# Patient Record
Sex: Female | Born: 1935 | Race: White | Hispanic: No
Health system: Southern US, Community
[De-identification: ages and names within clinical notes are randomized; demographics above are authoritative.]

## PROBLEM LIST (undated history)

## (undated) DIAGNOSIS — M810 Age-related osteoporosis without current pathological fracture: Secondary | ICD-10-CM

## (undated) DIAGNOSIS — E559 Vitamin D deficiency, unspecified: Secondary | ICD-10-CM

## (undated) DIAGNOSIS — H3530 Unspecified macular degeneration: Secondary | ICD-10-CM

## (undated) DIAGNOSIS — E049 Nontoxic goiter, unspecified: Secondary | ICD-10-CM

## (undated) DIAGNOSIS — E039 Hypothyroidism, unspecified: Secondary | ICD-10-CM

## (undated) HISTORY — PX: APPENDECTOMY: SHX54

## (undated) HISTORY — PX: BUNIONECTOMY: SHX129

## (undated) HISTORY — PX: TONSILLECTOMY: SUR1361

## (undated) HISTORY — PX: COLONOSCOPY: SHX174

## (undated) HISTORY — PX: OTHER SURGICAL HISTORY: SHX169

---

## 2001-07-18 ENCOUNTER — Encounter: Admission: RE | Admit: 2001-07-18 | Discharge: 2001-07-18 | Payer: Self-pay | Admitting: Internal Medicine

## 2001-07-18 ENCOUNTER — Encounter: Payer: Self-pay | Admitting: Internal Medicine

## 2004-07-29 ENCOUNTER — Ambulatory Visit: Payer: Self-pay | Admitting: Internal Medicine

## 2004-08-05 ENCOUNTER — Encounter: Admission: RE | Admit: 2004-08-05 | Discharge: 2004-08-05 | Payer: Self-pay | Admitting: Internal Medicine

## 2004-08-20 ENCOUNTER — Ambulatory Visit: Payer: Self-pay | Admitting: Internal Medicine

## 2004-09-18 ENCOUNTER — Ambulatory Visit (HOSPITAL_COMMUNITY): Admission: AD | Admit: 2004-09-18 | Discharge: 2004-09-20 | Payer: Self-pay | Admitting: General Surgery

## 2004-09-18 ENCOUNTER — Encounter (INDEPENDENT_AMBULATORY_CARE_PROVIDER_SITE_OTHER): Payer: Self-pay | Admitting: Specialist

## 2005-02-25 ENCOUNTER — Observation Stay (HOSPITAL_COMMUNITY): Admission: EM | Admit: 2005-02-25 | Discharge: 2005-02-25 | Payer: Self-pay | Admitting: Emergency Medicine

## 2005-03-01 ENCOUNTER — Ambulatory Visit (HOSPITAL_COMMUNITY): Admission: RE | Admit: 2005-03-01 | Discharge: 2005-03-01 | Payer: Self-pay | Admitting: Urology

## 2005-03-01 ENCOUNTER — Ambulatory Visit (HOSPITAL_BASED_OUTPATIENT_CLINIC_OR_DEPARTMENT_OTHER): Admission: RE | Admit: 2005-03-01 | Discharge: 2005-03-01 | Payer: Self-pay | Admitting: Urology

## 2005-08-31 ENCOUNTER — Ambulatory Visit: Payer: Self-pay | Admitting: Internal Medicine

## 2005-09-09 ENCOUNTER — Encounter (INDEPENDENT_AMBULATORY_CARE_PROVIDER_SITE_OTHER): Payer: Self-pay | Admitting: *Deleted

## 2005-09-09 ENCOUNTER — Ambulatory Visit: Payer: Self-pay | Admitting: Internal Medicine

## 2006-01-28 ENCOUNTER — Encounter: Admission: RE | Admit: 2006-01-28 | Discharge: 2006-01-28 | Payer: Self-pay | Admitting: Internal Medicine

## 2006-02-23 IMAGING — CT CT ABDOMEN W/O CM
1 of 2 series · 15 of 32 positions shown, 19 images · IV contrast (agent unspecified)
Comparison: none

CLINICAL DATA: Right flank pain.
 ABDOMEN CT WITHOUT CONTRAST:
TECHNIQUE: Multidetector CT imaging of the abdomen was performed following the standard protocol without IV contrast.
TECHNIQUE: Multidetector CT imaging of the pelvis was performed following the standard protocol without IV contrast.

[Series 2: abd/pelv w/o 5.0 b30f st · axial · non-contrast · 0.67mm/px · z∈[-373,-28]mm · 15 of 76 slices shown, 19 images]
[im 4/76  soft-tissue]
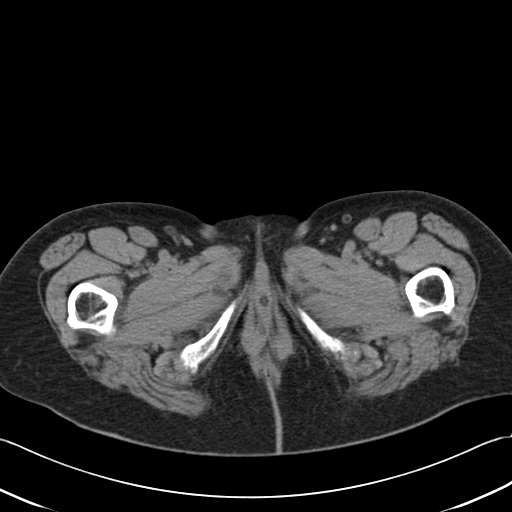
[im 4/76  bone]
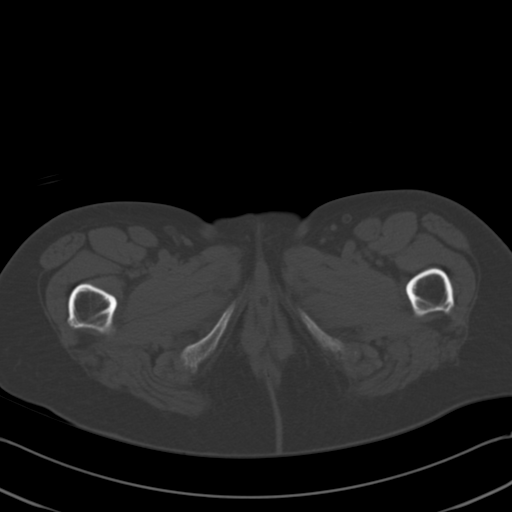
[im 10/76  soft-tissue]
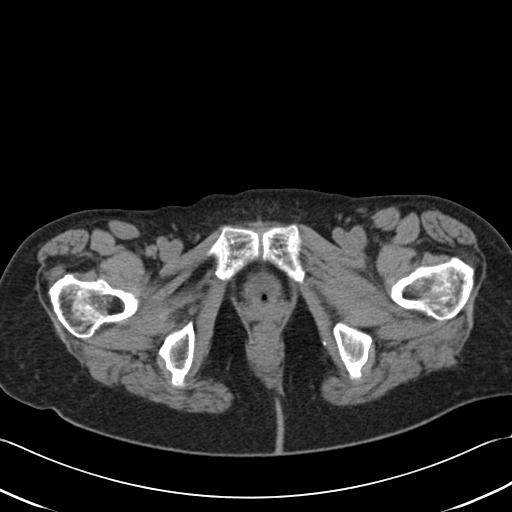
[im 16/76  soft-tissue]
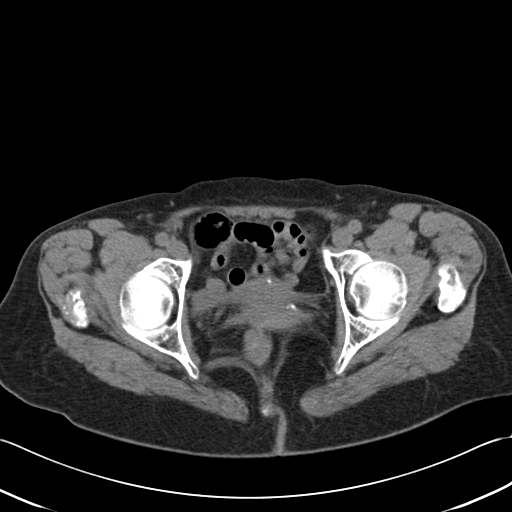
[im 22/76  soft-tissue]
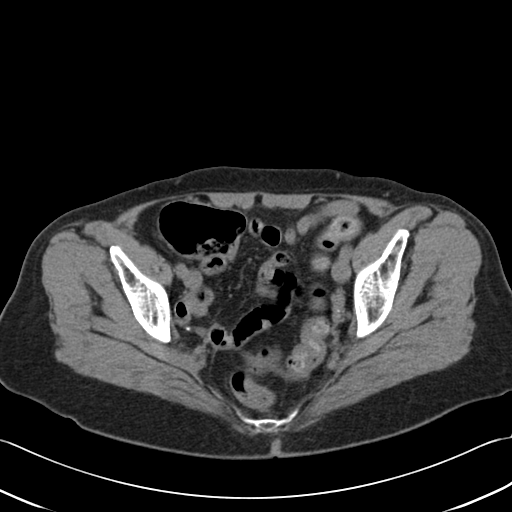
[im 28/76  soft-tissue]
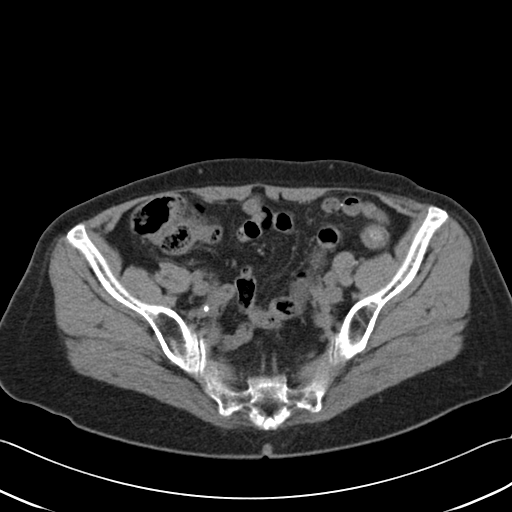
[im 34/76  soft-tissue]
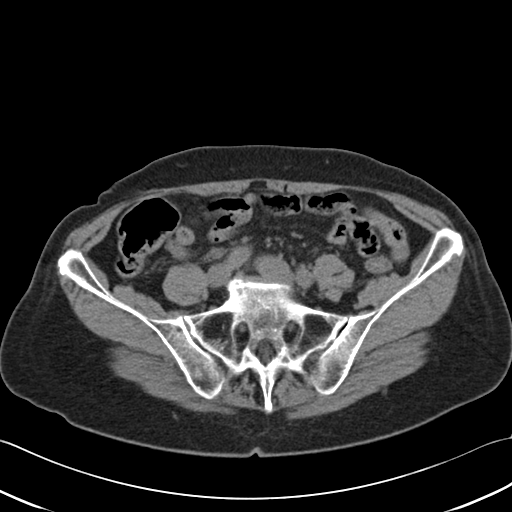
[im 40/76  soft-tissue]
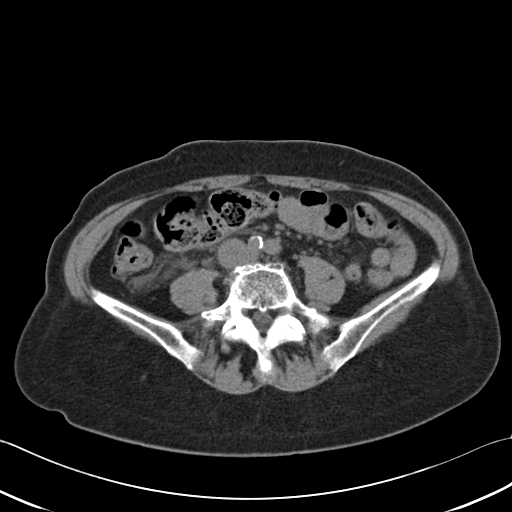
[im 43/76  soft-tissue]
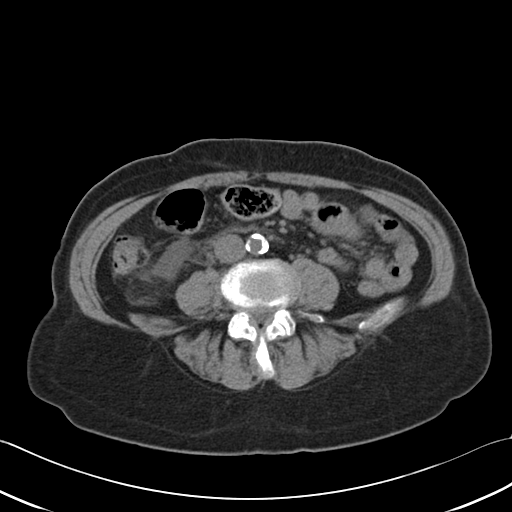
[im 49/76  soft-tissue]
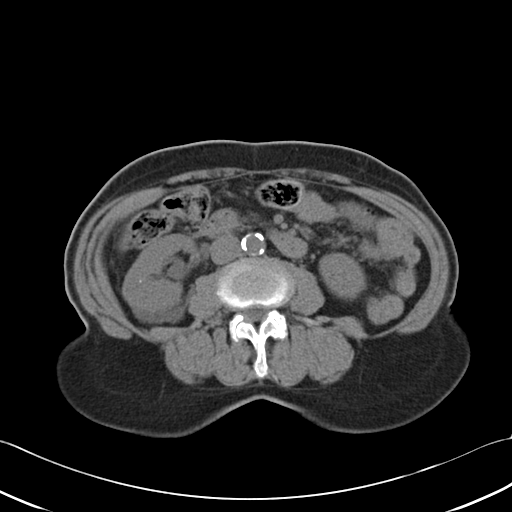
[im 49/76  bone]
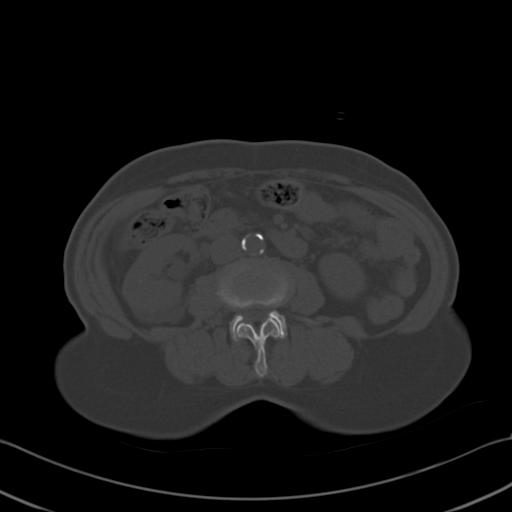
[im 55/76  soft-tissue]
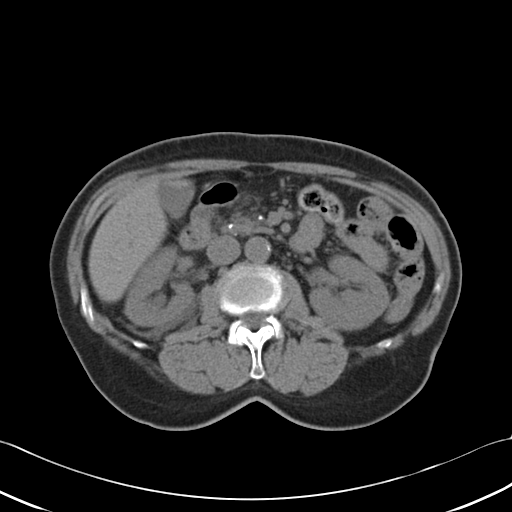
[im 61/76  soft-tissue]
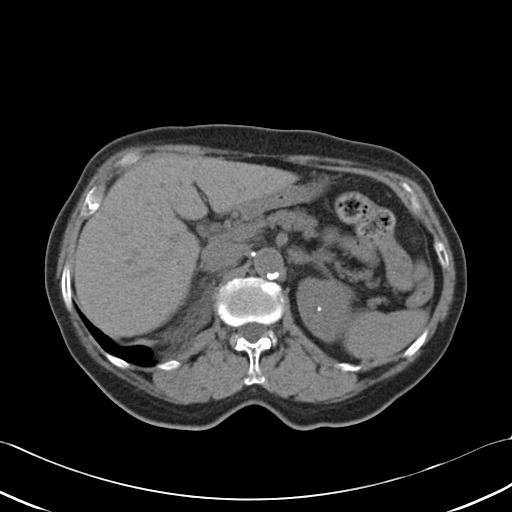
[im 64/76  lung]
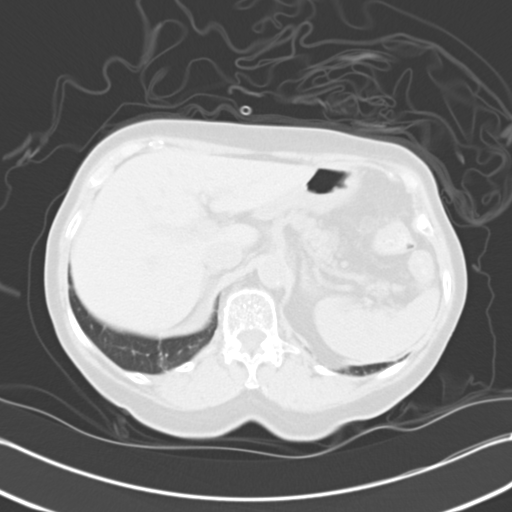
[im 67/76  soft-tissue]
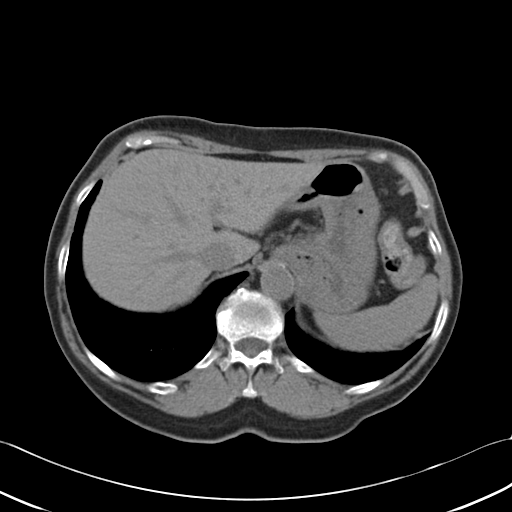
[im 67/76  lung]
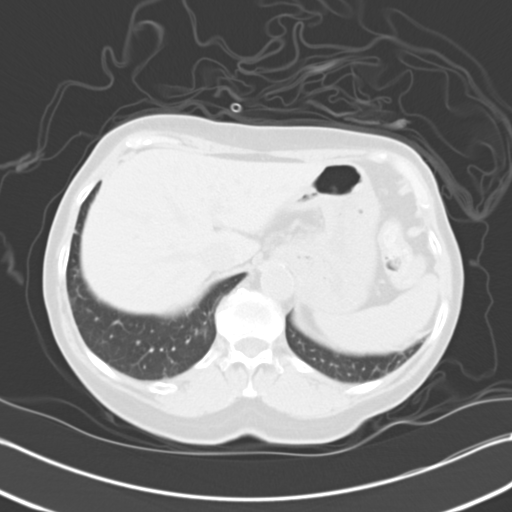
[im 70/76  lung]
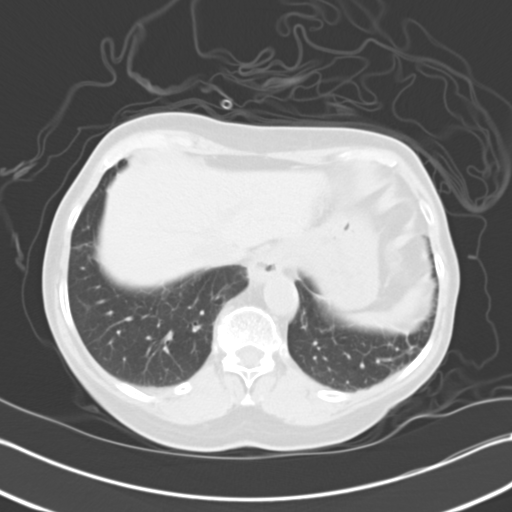
[im 73/76  soft-tissue]
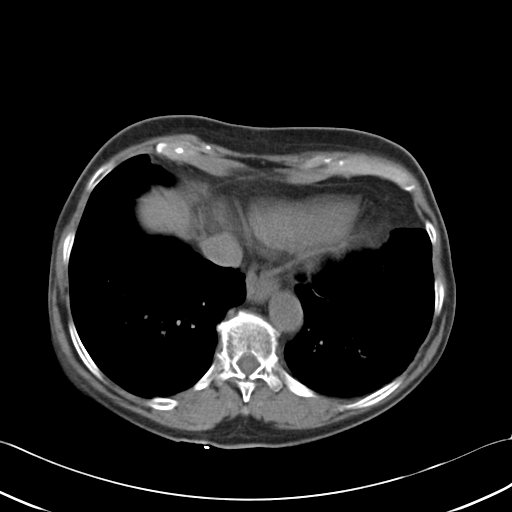
[im 73/76  lung]
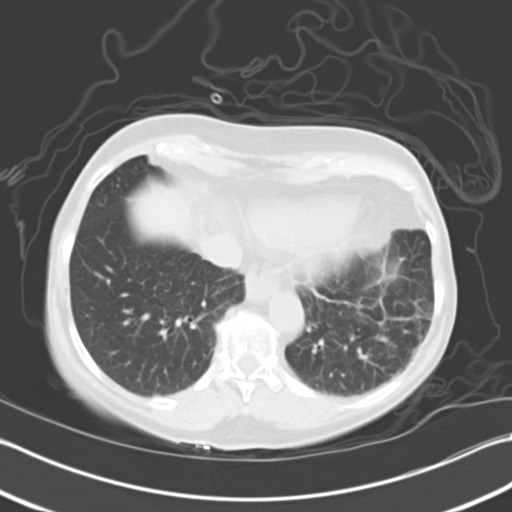

[15 of 32 positions shown; findings below may reference images not displayed]

FINDINGS: Linear scarring is noted particularly in the left lower lobe and lingula.  A small hiatal hernia present.  There is moderate right hydronephrosis and some perinephric fluid is noted along the posterior pararenal space.  This probably indicated forniceal rupture.  Bilateral renal calculi are present with four calculi on the right and one on the left.  The right ureter is dilated into the pelvis and CT of the pelvis will be performed.  The remainder of the study shows the liver to appear normal in the unenhanced state.  The pancreas is normal in size with normal peripancreatic fat planes.  The adrenal glands also appear normal and the spleen is normal in size.
IMPRESSION: 1.  Moderate right hydronephrosis and hydroureter into the pelvis.  CT of the pelvis to be performed.  Some perinephric fluid is noted on the right probably due to forniceal rupture.
 2.  Renal calculi, right more numerous than left.
 3.  Small hiatal hernia.
 PELVIS CT WITHOUT CONTRAST:
FINDINGS: Scans were continued through the pelvis in the unenhanced state.  The right ureter remains dilated to a point of obstruction by a distal right ureteral calculus of approximately 5 mm.  The ureter is difficult to follow in the pelvis, but I believe the obstructing calculus is 5 mm as seen on image #49.  The urinary bladder is decompressed with a Foley catheter present.  The left ureter is normal in caliber.
IMPRESSION: Moderate right hydronephrosis and hydroureter caused by a 5 mm distal right ureteral calculus at the level of the inferior aspect of the right sacroiliac joint.

## 2006-02-27 IMAGING — CR DG ABDOMEN 1V
1 series · 1 of 1 positions shown · non-contrast
Comparison: CT dated 02/25/05.

CLINICAL DATA: Ureteral calculus on the right.  
 SINGLE FRONTAL ABDOMINAL RADIOGRAPH ? 03/01/05:

[t abdomen supine]
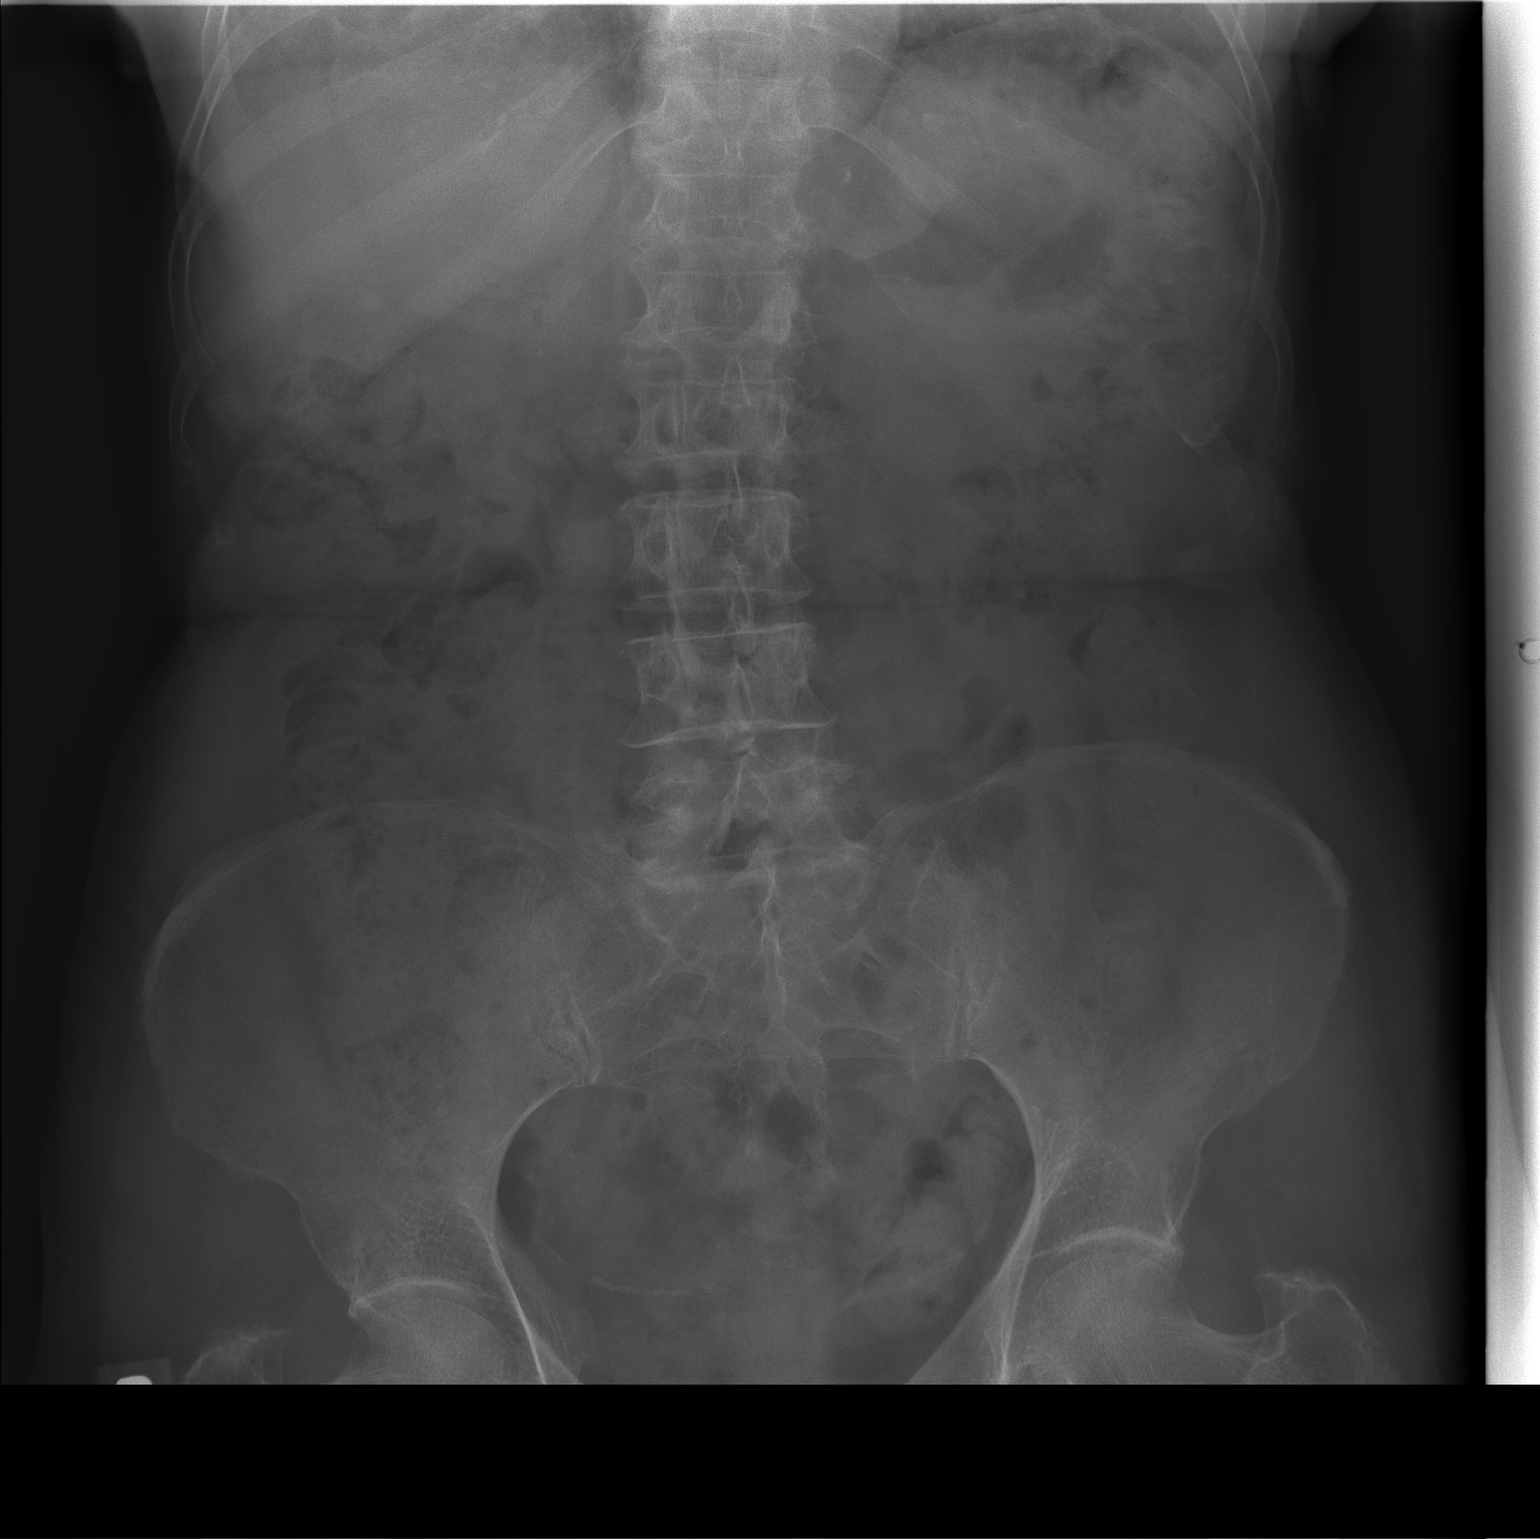

[1 of 1 positions shown; findings below may reference images not displayed]

FINDINGS: The bowel gas pattern is normal.  There is an 8 mm calcific opacity overlying the right upper quadrant as well as two vague oval densities overlying the sacrum.  No prior study is available prior to the prior CT.  One of the two densities overlying the sacrum may represent the previously seen calculus at that level on the prior CT.  Osseous structures are intact.
IMPRESSION: Three nodular opacities overlying the right upper quadrant and sacrum respectively; one of the two lower densities may represent the previously seen calculus on the prior CT, although it is difficult to correlate the exact location with the prior study due to differences in technique.

## 2006-09-12 ENCOUNTER — Ambulatory Visit: Payer: Self-pay | Admitting: Internal Medicine

## 2006-09-26 ENCOUNTER — Encounter (INDEPENDENT_AMBULATORY_CARE_PROVIDER_SITE_OTHER): Payer: Self-pay | Admitting: Specialist

## 2006-09-26 ENCOUNTER — Ambulatory Visit: Payer: Self-pay | Admitting: Internal Medicine

## 2007-10-30 ENCOUNTER — Ambulatory Visit: Payer: Self-pay | Admitting: Internal Medicine

## 2007-11-09 ENCOUNTER — Ambulatory Visit: Payer: Self-pay | Admitting: Internal Medicine

## 2007-11-09 ENCOUNTER — Encounter: Payer: Self-pay | Admitting: Internal Medicine

## 2008-11-05 ENCOUNTER — Encounter: Admission: RE | Admit: 2008-11-05 | Discharge: 2008-11-05 | Payer: Self-pay | Admitting: Internal Medicine

## 2008-11-07 ENCOUNTER — Encounter (INDEPENDENT_AMBULATORY_CARE_PROVIDER_SITE_OTHER): Payer: Self-pay | Admitting: *Deleted

## 2008-11-19 ENCOUNTER — Ambulatory Visit: Payer: Self-pay | Admitting: Internal Medicine

## 2008-12-03 ENCOUNTER — Encounter: Payer: Self-pay | Admitting: Internal Medicine

## 2008-12-03 ENCOUNTER — Ambulatory Visit: Payer: Self-pay | Admitting: Internal Medicine

## 2008-12-05 ENCOUNTER — Encounter: Payer: Self-pay | Admitting: Internal Medicine

## 2009-12-09 ENCOUNTER — Encounter (INDEPENDENT_AMBULATORY_CARE_PROVIDER_SITE_OTHER): Payer: Self-pay | Admitting: *Deleted

## 2010-01-15 ENCOUNTER — Encounter (INDEPENDENT_AMBULATORY_CARE_PROVIDER_SITE_OTHER): Payer: Self-pay | Admitting: *Deleted

## 2010-01-19 ENCOUNTER — Ambulatory Visit: Payer: Self-pay | Admitting: Internal Medicine

## 2010-01-30 ENCOUNTER — Ambulatory Visit: Payer: Self-pay | Admitting: Internal Medicine

## 2010-05-21 ENCOUNTER — Encounter: Admission: RE | Admit: 2010-05-21 | Discharge: 2010-05-21 | Payer: Self-pay | Admitting: Internal Medicine

## 2010-08-13 NOTE — Letter (Signed)
Summary: [facility] Instructions  [facility]  [address] [place], [place] [address]   Phone: [phone]  Fax: [phone]       Kari Garcia    [birth year]    MRN: [id number]        Procedure Day /Date:  Friday 01/30/2010     Arrival Time: 7:30 am      Procedure Time: 8:30 am     Location of Procedure:                    _x _  [facility] (4th Floor)                        PREPARATION FOR COLONOSCOPY WITH MOVIPREP   Starting 5 days prior to your procedure Sunday 7/17 do not eat nuts, seeds, popcorn, corn, beans, peas,  salads, or any raw vegetables.  Do not take any fiber supplements (e.g. Metamucil, Citrucel, and Benefiber).  THE DAY BEFORE YOUR PROCEDURE         DATE: Thursday 7/21  1.  Drink clear liquids the entire day-NO SOLID FOOD  2.  Do not drink anything colored red or purple.  Avoid juices with pulp.  No orange juice.  3.  Drink at least 64 oz. (8 glasses) of fluid/clear liquids during the day to prevent dehydration and help the prep work efficiently.  CLEAR LIQUIDS INCLUDE: Water Jello Ice Popsicles Tea (sugar ok, no milk/cream) Powdered fruit flavored drinks Coffee (sugar ok, no milk/cream) Gatorade Juice: apple, white grape, white cranberry  Lemonade Clear bullion, consomm, broth Carbonated beverages (any kind) Strained chicken noodle soup Hard Candy                             4.  In the morning, mix first dose of MoviPrep solution:    Empty 1 Pouch A and 1 Pouch B into the disposable container    Add lukewarm drinking water to the top line of the container. Mix to dissolve    Refrigerate (mixed solution should be used within 24 hrs)  5.  Begin drinking the prep at 5:00 p.m. The MoviPrep container is divided by 4 marks.   Every 15 minutes drink the solution down to the next mark (approximately 8 oz) until the full liter is complete.   6.  Follow completed prep with 16 oz of clear liquid of your choice (Nothing  red or purple).  Continue to drink clear liquids until bedtime.  7.  Before going to bed, mix second dose of MoviPrep solution:    Empty 1 Pouch A and 1 Pouch B into the disposable container    Add lukewarm drinking water to the top line of the container. Mix to dissolve    Refrigerate  THE DAY OF YOUR PROCEDURE      DATE: Friday 7/22  Beginning at 3:30 a.m. (5 hours before procedure):         1. Every 15 minutes, drink the solution down to the next mark (approx 8 oz) until the full liter is complete.  2. Follow completed prep with 16 oz. of clear liquid of your choice.    3. You may drink clear liquids until 6:30 am (2 HOURS BEFORE PROCEDURE).   MEDICATION INSTRUCTIONS  Unless otherwise instructed, you should take regular prescription medications with a small sip of water   as early as possible the morning of  your procedure.           OTHER INSTRUCTIONS  You will need a responsible adult at least 75 years of age to accompany you and drive you home.   This person must remain in the waiting room during your procedure.  Wear loose fitting clothing that is easily removed.  Leave jewelry and other valuables at home.  However, you may wish to bring a book to read or  an iPod/MP3 player to listen to music as you wait for your procedure to start.  Remove all body piercing jewelry and leave at home.  Total time from sign-in until discharge is approximately 2-3 hours.  You should go home directly after your procedure and rest.  You can resume normal activities the  day after your procedure.  The day of your procedure you should not:   Drive   Make legal decisions   Operate machinery   Drink alcohol   Return to work  You will receive specific instructions about eating, activities and medications before you leave.    The above instructions have been reviewed and explained to me by   Ezra Sites RN  January 19, 2010 10:35 AM     I fully understand and can  verbalize these instructions _____________________________ Date _________

## 2010-08-13 NOTE — Letter (Signed)
Summary: Colonoscopy Letter  [facility]  [address] [place], [place] [address]   Phone: [phone]  Fax: [phone]      Dec 09, 2009 MRN: [id number]   [facility]: [name] [address] [place], [place]  [address]   Dear [name],   According to your medical record, it is time for you to schedule a Colonoscopy. The American Cancer Society recommends this procedure as a method to detect early colon cancer. Patients with a family history of colon cancer, or a personal history of colon polyps or inflammatory bowel disease are at increased risk.  This letter has beeen generated based on the recommendations made at the time of your procedure. If you feel that in your particular situation this may no longer apply, please contact our office.  Please call our office at [phone] to schedule this appointment or to update your records at your earliest convenience.  Thank you for cooperating with [place] to provide you with the very best care possible.   Sincerely,  Wilhemina Bonito. Marina Goodell, M.D.  [facility] [phone]

## 2010-08-13 NOTE — Miscellaneous (Signed)
Summary: [facility]  Clinical Lists Changes  Medications: Added new medication of MOVIPREP 100 GM  SOLR (PEG-KCL-NACL-NASULF-NA ASC-C) As per prep instructions. - Signed Rx of MOVIPREP 100 GM  SOLR (PEG-KCL-NACL-NASULF-NA ASC-C) As per prep instructions.;  #1 x 0;  Signed;  Entered by: Ezra Sites RN;  Authorized by: Hilarie Fredrickson MD;  Method used: Electronically to [facility] [place], [place], [place]  [address], Ph: [phone], Fax: [phone] Observations: Added new observation of ALLERGY REV: Done (01/19/2010 9:30)    Prescriptions: MOVIPREP 100 GM  SOLR (PEG-KCL-NACL-NASULF-NA ASC-C) As per prep instructions.  #1 x 0   Entered by:   Ezra Sites RN   Authorized by:   Hilarie Fredrickson MD   Signed by:   Ezra Sites RN on 01/19/2010   Method used:   Electronically to        [facility] (retail)       [address]       [place], [place]  [address]       Ph: [phone]       Fax: [phone]   RxID:   [id number]

## 2010-08-13 NOTE — Procedures (Signed)
Summary: Colonoscopy  Patient: Kari Garcia Note: All result statuses are Final unless otherwise noted.  Tests: (1) Colonoscopy (COL)   COL Colonoscopy           DONE     [facility]     [address]     [place], [place]  [address]           COLONOSCOPY PROCEDURE REPORT           PATIENT:  Kari Garcia, Kari Garcia  MR#:  [id number]     BIRTHDATE:  Apr 27, 1925, 84 yrs. old  GENDER:  female     ENDOSCOPIST:  Wilhemina Bonito. Eda Keys, MD     REF. BY:  Surveillance Program Recall,     PROCEDURE DATE:  01/30/2010     PROCEDURE:  Surveillance Colonoscopy,     Higher-risk screening colonoscopy     G0105           ASA CLASS:  Class II     INDICATIONS:  history of pre-cancerous (adenomatous) colon polyps     ;recurrent TVA rectum w/ multiple prior exams for therapeutic     excision. Last exam 1 yr ago     MEDICATIONS:   Fentanyl 75 mcg IV, Versed 7 mg IV           DESCRIPTION OF PROCEDURE:   After the risks benefits and     alternatives of the procedure were thoroughly explained, informed     consent was obtained.  Digital rectal exam was performed and     revealed no abnormalities.   The LB CF-[id number] endoscope     was introduced through the anus and advanced to the cecum, which     was identified by both the appendix and ileocecal valve, without     limitations.  The quality of the prep was excellent, using     MoviPrep.  The instrument was then slowly withdrawn as the colon     was fully examined.     <<PROCEDUREIMAGES>>           FINDINGS:  A normal appearing cecum, ileocecal valve, and     appendiceal orifice were identified. The ascending, hepatic     flexure, transverse, splenic flexure, descending, sigmoid colon,     and rectum appeared unremarkable.  No polyps or cancers were seen.     NO RESIDUAL RECTAL POLYP.  Retroflexed views in the rectum     revealed no abnormalities.    The scope was then withdrawn from     the patient and the procedure completed.        COMPLICATIONS:  None     ENDOSCOPIC IMPRESSION:     1) Normal colon     2) No polyps or cancers     RECOMMENDATIONS:     1) Return to the care of your primary provider. GI follow up as     needed           ______________________________     Wilhemina Bonito. Eda Keys, MD           CC:  The Patient;Burke Janee Morn, MD;Richard Wylene Simmer, MD           n.     Rosalie DoctorWilhemina Bonito. Eda Keys at 01/30/2010 09:29 AM           Richarda Osmond, [id number]  Note: An exclamation mark (!) indicates a result that was not dispersed into the flowsheet. Document Creation Date: 01/30/2010 9:30  AM _______________________________________________________________________  (1) Order result status: Final Collection or observation date-time: 01/30/2010 09:22 Requested date-time:  Receipt date-time:  Reported date-time:  Referring Physician:   Ordering Physician: Fransico Setters [id number]) Specimen Source:  Source: Launa Grill Order Number: [id number] Lab site:

## 2010-11-24 NOTE — Assessment & Plan Note (Signed)
[facility]                         GASTROENTEROLOGY OFFICE NOTE   Kari Garcia, Kari Garcia                       MRN:          [id number]  DATE:10/30/2007                            DOB:          1924-11-05    HISTORY:  This is an 77 year old white female with history of large,  tubulovillous adenoma of the rectum status post surgical resection in  2006.  Subsequent surveillance exam revealing recurrent tubulovillous  adenoma in 2007 and again in 2008.  Followup in one year recommended.  Patient reports that her health has been stable.  She recently had what  sounds like viral gastroenteritis with nausea, vomiting, decreased  appetite, loose stools.  This has gradually improved.  No problems with  abdominal pain or bleeding.  Her weight has been stable.   PAST MEDICAL HISTORY:  1. Hypothyroidism.  2. Osteoarthritis.  3. Glaucoma.   PAST SURGICAL HISTORY:  1. Breast surgery for benign disease.  2. Appendectomy.  3. Tonsillectomy.  4. Transanal resection of tubulovillous adenoma.   ALLERGIES:  CODEINE.   CURRENT MEDICATIONS:  1. Synthroid 75 mcg daily.  2. Eye drops.  3. Vitamin D.  4. Fosamax once weekly.   FAMILY HISTORY:  Negative for gastrointestinal malignancy.   SOCIAL HISTORY:  Patient is widowed and lines alone.  She is retired.  Does not smoke or use alcohol.   REVIEW OF SYSTEMS:  Per diagnostic evaluation form.   PHYSICAL EXAMINATION:  Pleasant, well appearing female in no acute  distress.  She is alert and oriented.  Her blood pressure is 136/64,  heart rate is 80.  Weight is 117 pounds.  She 4 feet 11 inches in  height.  HEENT:  Sclerae anicteric.  Conjunctivae are pink.  Oral mucosa is  intact.  No adenopathy.  LUNGS:  Clear.  HEART:  Regular.  ABDOMEN:  Sof without tenderness, mass or hernia.  Good bowel sounds  heard.  EXTREMITIES:  Without edema.   IMPRESSION:  1. This is an 77 year old female with large tubulovillous  adenoma of      the rectum status post surgical resection with subsequent      recurrence of tumor.  Currently due for surveillance.  Clinically      stable and an appropriate candidate.  The nature of colonoscopy as      well as the risks, benefits and alternatives were reviewed in      detail.  She understood and was interested in proceeding.  2. Ongoing general medical care with Dr. Wylene Simmer.     Wilhemina Bonito. Marina Goodell, MD  Electronically Signed    JNP/MedQ  DD: 10/30/2007  DT: 10/30/2007  Job #: [id number]   cc:   Gaspar Garbe, M.D.

## 2010-11-27 NOTE — Op Note (Signed)
Kari Garcia, Kari Garcia              ACCOUNT NO.:  [id number]   MEDICAL RECORD NO.:  [id number]          PATIENT TYPE:  AMB   LOCATION:  [facility]                         FACILITY:  [facility]   PHYSICIAN:  Mark C. Vernie Ammons, M.D.  DATE OF BIRTH:  [birth year]   DATE OF PROCEDURE:  03/01/2005  DATE OF DISCHARGE:                                 OPERATIVE REPORT   PREOPERATIVE DIAGNOSIS:  Right ureteral calculus.   POSTOPERATIVE DIAGNOSIS:  Right ureteral calculus.   PROCEDURE:  Cystoscopy and right retrograde pyelogram with interpretation.   SURGEON:  Mark C. Vernie Ammons, M.D.   ANESTHESIA:  General.   BLOOD LOSS:  None.   DRAINS:  None.   SPECIMENS:  None.   COMPLICATIONS:  None.   INDICATIONS:  The Patient is a 77 year old white female who I saw last  Thursday in the emergency room with a 5 mm right ureteral calculus with some  extravasation from a forniceal rupture. She came in with severe pain but by  the time I saw her, she was completely pain free. She followed up in my  office the next day and remained pain free but never saw any stone pass. She  was brought to the OR today for ureteroscopic extraction of her stone if  still present. We discussed the procedure, its risks, complications,  possibility of having passed the stone already. She understands and elected  to proceed.   DESCRIPTION OF OPERATION:  After informed consent, the patient was a brought  to the major OR, placed on the table, administered general anesthesia and  then moved to the dorsal lithotomy position. Her genitalia was sterilely  prepped and draped. The 21-French cystoscope was then introduced in the  bladder and the bladder was fully inspected with a 12 degree lens. I noted  no tumor, stones or inflammatory lesions. Ureteral orifices normal  configuration and position.   A 6-French open-end ureteral catheter was then placed in the distal right  ureter and a retrograde pyelogram performed under direct  fluoroscopy. As  contrast was injected, it was visualized to pass up the ureter unobstructed  throughout its entire course. There were no filling defects or other  abnormality. The intrarenal collecting system was also noted to be entirely  normal with no filling defects, lesions or mass effect. I then removed the  open-end stent and observed the contrast pass, unobstructed down the right  ureter, and on out the right ureteral orifice. I therefore drained the  bladder and removed the cystoscope and the patient was awakened and taken to  recovery room. It  appears she had passed her stone and I could not see it on her KUB in my  office or the KUB done today, however, she did have blood in her urine and  never saw the stone past. My recommendation to her has been to follow-up  with me in 6 months for a KUB since she has known small bilateral renal  calculi.      Mark C. Vernie Ammons, M.D.  Electronically Signed     MCO/MEDQ  D:  03/01/2005  T:  03/01/2005  Job:  [id number]

## 2010-11-27 NOTE — Op Note (Signed)
NAMEGILA, Kari Garcia              ACCOUNT NO.:  [id number]   MEDICAL RECORD NO.:  [id number]          PATIENT TYPE:  OIB   LOCATION:  2899                         FACILITY:  [facility]   PHYSICIAN:  Gabrielle Dare. Janee Morn, M.D.DATE OF BIRTH:  1925-03-26   DATE OF PROCEDURE:  09/18/2004  DATE OF DISCHARGE:                                 OPERATIVE REPORT   PREOPERATIVE DIAGNOSIS:  Rectal polyp.   POSTOPERATIVE DIAGNOSIS:  Rectal polyp.   PROCEDURES:  Transanal excision of rectal polyp.   SURGEON:  Gabrielle Dare. Janee Morn, M.D.   ANESTHESIA:  General.   HISTORY OF PRESENT ILLNESS:  The patient is a 77 year old white female who  was noted to have a large rectal polyp on colonoscopy done by Wilhemina Bonito. Marina Goodell,  M.D.  Biopsies of this polyp demonstrated tubulovillous adenoma.  The  patient presents for elective transanal excision.   PROCEDURE IN DETAIL:  Informed consent was obtained.  The patient was  identified.  She was brought to the operating room and general anesthesia  was administered.  She was placed in the lithotomy position and her perianal  region was prepped and draped in the sterile fashion.  Digital rectal exam  revealed a large exophytic, 4 x 3 cm, polypoid mass.  It was easily  delivered from the rectum.  A 2-0 Vicryl suture was placed on the normal  mucosa proximal to the beginning portion of the tumor.  Some 1% lidocaine  with epinephrine was injected in the perianal region and in the submucosal  plane.  A 15 blade was used to excise the polyp, leaving a small cuff of  normal rectal mucosa.  The polyp did extend down to the dentate line and a  little bit distal.  The polyp was excised.  Subsequently the 3-0 Vicryl  suture that had been placed was then used to close the mucosal defect in a  running fashion and this was tied securely.  The polyp was suture for  orientation and sent to pathology.  Some pressure was held on the suture  line where the mucosal closure was done, but it  continued to have some  bleeding.  An additional figure-of-eight Vicryl suture was placed near the  proximal portion of the suture line and subsequently an additional small  length of running Vicryl was placed near the distal portion of the suture  line in order to get hemostasis.  Once this was accomplished, the bleeding  stopped.  Pressure with a little Ray-Tec sponge was held for a couple of  minutes.  The area was rechecked and it was completely dry.  At this time,  some Gelfoam with thrombin was folded into a cigar and placed to be left as  the rectal dressing.  A further sterile dressing applied.  Sponge, needle,  and instrument counts were correct.  The patient tolerated the procedure  well and was taken to the recovery room in stable condition.     BET/MEDQ  D:  09/18/2004  T:  09/19/2004  Job:  [id number]

## 2010-11-27 NOTE — Consult Note (Signed)
Kari Garcia, Kari Garcia              ACCOUNT NO.:  [id number]   MEDICAL RECORD NO.:  [id number]          PATIENT TYPE:  OBV   LOCATION:  1828                         FACILITY:  [facility]   PHYSICIAN:  Mark C. Vernie Ammons, M.D.  DATE OF BIRTH:  [birth year]   DATE OF CONSULTATION:  02/25/2005  DATE OF DISCHARGE:  02/25/2005                                   CONSULTATION   HISTORY:  The patient is a 77 year old white female who has never had a  kidney stone before. She was out painting her fence today and began to have  some pain in her right flank. It steadily increased in intensity radiating  into the anterior abdomen. It was associated with an urge to void but she  said she found it difficult to void. The pain became severe, it was  associated with nausea and vomiting. She has not seen any gross hematuria.  She was brought to the emergency room for further evaluation.   Here in the emergency room, an IV was started on the patient and she was  given pain medications. There was some difficulty controlling her pain,  however. When I was contacted to see her initially I found her intravenous  fluids were running at 250 mL an hour which was very likely increasing the  pain as her kidneys continued to try to force urine out beyond an  obstructing stone. I therefore had the IV fluids placed to [facility] and now the  pt. is completely pain free.   PAST MEDICAL HISTORY:  Positive for hypothyroidism.   PAST SURGICAL HISTORY:  She has had surgery on her left eye.  Rectal tumor excised  Appendectomy  Tonsillectomy  Bunions excised from feet   MEDICATIONS:  Synthroid.   ALLERGIES:  Darvocet   SOCIAL HISTORY:  No tobacco or ethanol use.   FAMILY HISTORY:  Noncontributory.   REVIEW OF SYMPTOMS:  As noted above otherwise is completely negative.   PHYSICAL EXAMINATION:  VITAL SIGNS:  Her temperature is 97.1, blood pressure  is 119/58, pulse 60.  GENERAL:  The patient is a well-developed, well-nourished,  white female in  no apparent distress.  HEENT:  Normocephalic, atraumatic. Oropharynx is clear. She has post  surgical changes of the left iris.  NECK:  Supple with midline trachea.  CHEST:  Reveals normal respiratory effort.  CARDIOVASCULAR:  Regular rate and rhythm.  ABDOMEN:  Soft and nontender. She had no CVA tenderness to percussion, no  abdominal mass noted.  PELVIC:  She has normal external female genitalia.  SKIN:  Warm and dry.  NEUROLOGIC:  She has no gross focal neurologic deficits.  EXTREMITIES:  Without cyanosis, clubbing or edema.   LABORATORY DATA:  Urinalysis was negative for leukocyte esterase or nitrite.  It was otherwise clear. White count was 10.3, hemoglobin 11.6, hematocrit  36.0, platelets 255,000. Electrolytes were normal with a creatinine of 1.0,  calcium is normal at 9.4.   CT SCAN:  The CT scan films were reviewed by me and revealed bilateral  punctate renal calculi. There is some perinephric stranding on the right  hand side and  dilatation of the ureter down to stone in the distal ureter  approximately 5 mm in size.   IMPRESSION:  1.  Small bilateral renal calculi.  2.  Right ureteral calculus. The patient is completely pain free with no      fever, clear urine and no elevation in her white count.  I have      discussed admission versus discharge with pain medication. She would      like to be discharged. I would like to see her back in my office      tomorrow morning for followup.   PLAN:  1.  Mepergan fortis 1-2 q.4 h p.r.n. #38.  2.  I gave her my card and directions to the office. She will see me      tomorrow morning for that visit. She also has my phone number and will      contact me if she has any further difficulty this evening.      Mark C. Vernie Ammons, M.D.  Electronically Signed     [facility]  D:  02/25/2005  T:  02/26/2005  Job:  [id number]

## 2011-06-09 ENCOUNTER — Other Ambulatory Visit: Payer: Self-pay | Admitting: Internal Medicine

## 2011-06-09 DIAGNOSIS — Z1231 Encounter for screening mammogram for malignant neoplasm of breast: Secondary | ICD-10-CM

## 2011-07-08 ENCOUNTER — Ambulatory Visit
Admission: RE | Admit: 2011-07-08 | Discharge: 2011-07-08 | Disposition: A | Discharge status: Home / Self Care | Payer: Medicare Other | Source: Ambulatory Visit | Attending: Internal Medicine | Admitting: Internal Medicine

## 2011-07-08 DIAGNOSIS — Z1231 Encounter for screening mammogram for malignant neoplasm of breast: Secondary | ICD-10-CM

## 2013-11-20 ENCOUNTER — Other Ambulatory Visit: Payer: Self-pay | Admitting: Internal Medicine

## 2013-11-20 DIAGNOSIS — Z1231 Encounter for screening mammogram for malignant neoplasm of breast: Secondary | ICD-10-CM

## 2013-12-04 ENCOUNTER — Ambulatory Visit
Admission: RE | Admit: 2013-12-04 | Discharge: 2013-12-04 | Disposition: A | Discharge status: Home / Self Care | Payer: Medicare Other | Source: Ambulatory Visit | Attending: Internal Medicine | Admitting: Internal Medicine

## 2013-12-04 ENCOUNTER — Encounter (INDEPENDENT_AMBULATORY_CARE_PROVIDER_SITE_OTHER): Payer: Self-pay

## 2013-12-04 DIAGNOSIS — Z1231 Encounter for screening mammogram for malignant neoplasm of breast: Secondary | ICD-10-CM

## 2014-10-01 ENCOUNTER — Encounter (HOSPITAL_COMMUNITY): Payer: Self-pay

## 2014-10-01 ENCOUNTER — Ambulatory Visit (HOSPITAL_COMMUNITY)
Admission: RE | Admit: 2014-10-01 | Discharge: 2014-10-01 | Disposition: A | Discharge status: Home / Self Care | Payer: Medicare Other | Source: Ambulatory Visit | Attending: Internal Medicine | Admitting: Internal Medicine

## 2014-10-01 ENCOUNTER — Other Ambulatory Visit (HOSPITAL_COMMUNITY): Payer: Self-pay | Admitting: Internal Medicine

## 2014-10-01 DIAGNOSIS — M810 Age-related osteoporosis without current pathological fracture: Secondary | ICD-10-CM | POA: Diagnosis present

## 2014-10-01 HISTORY — DX: Hypothyroidism, unspecified: E03.9

## 2014-10-01 HISTORY — DX: Vitamin D deficiency, unspecified: E55.9

## 2014-10-01 HISTORY — DX: Age-related osteoporosis without current pathological fracture: M81.0

## 2014-10-01 MED ORDER — DENOSUMAB 60 MG/ML ~~LOC~~ SOLN
60.0000 mg | Freq: Once | SUBCUTANEOUS | Status: AC
  Administered 2014-10-01: 60 mg via SUBCUTANEOUS
  Filled 2014-10-01: qty 1

## 2014-10-01 NOTE — Discharge Instructions (Signed)
ATTENTION:  If you are going to be 15 or more minutes late for your appointment, please call [phone] to make other arrangements for your treatment.  If you arrive early for your schedule appointment, you may have to wait until your scheduled time.Denosumab injection What is this medicine? DENOSUMAB (den oh sue mab) slows bone breakdown. Prolia is used to treat osteoporosis in women after menopause and in men. Rivka BarbaraXgeva is used to prevent bone fractures and other bone problems caused by cancer bone metastases. Rivka BarbaraXgeva is also used to treat giant cell tumor of the bone. This medicine may be used for other purposes; ask your health care provider or pharmacist if you have questions. COMMON BRAND NAME(S): Prolia, XGEVA What should I tell my health care provider before I take this medicine? They need to know if you have any of these conditions: -dental disease -eczema -infection or history of infections -kidney disease or on dialysis -low blood calcium or vitamin D -malabsorption syndrome -scheduled to have surgery or tooth extraction -taking medicine that contains denosumab -thyroid or parathyroid disease -an unusual reaction to denosumab, other medicines, foods, dyes, or preservatives -pregnant or trying to get pregnant -breast-feeding How should I use this medicine? This medicine is for injection under the skin. It is given by a health care professional in a hospital or clinic setting. If you are getting Prolia, a special MedGuide will be given to you by the pharmacist with each prescription and refill. Be sure to read this information carefully each time. For Prolia, talk to your pediatrician regarding the use of this medicine in children. Special care may be needed. For Rivka BarbaraXgeva, talk to your pediatrician regarding the use of this medicine in children. While this drug may be prescribed for children as young as 13 years for selected conditions, precautions do apply. Overdosage: If you think you've  taken too much of this medicine contact a poison control center or emergency room at once. Overdosage: If you think you have taken too much of this medicine contact a poison control center or emergency room at once. NOTE: This medicine is only for you. Do not share this medicine with others. What if I miss a dose? It is important not to miss your dose. Call your doctor or health care professional if you are unable to keep an appointment. What may interact with this medicine? Do not take this medicine with any of the following medications: -other medicines containing denosumab This medicine may also interact with the following medications: -medicines that suppress the immune system -medicines that treat cancer -steroid medicines like prednisone or cortisone This list may not describe all possible interactions. Give your health care provider a list of all the medicines, herbs, non-prescription drugs, or dietary supplements you use. Also tell them if you smoke, drink alcohol, or use illegal drugs. Some items may interact with your medicine. What should I watch for while using this medicine? Visit your doctor or health care professional for regular checks on your progress. Your doctor or health care professional may order blood tests and other tests to see how you are doing. Call your doctor or health care professional if you get a cold or other infection while receiving this medicine. Do not treat yourself. This medicine may decrease your body's ability to fight infection. You should make sure you get enough calcium and vitamin D while you are taking this medicine, unless your doctor tells you not to. Discuss the foods you eat and the vitamins you take with your  health care professional. See your dentist regularly. Brush and floss your teeth as directed. Before you have any dental work done, tell your dentist you are receiving this medicine. Do not become pregnant while taking this medicine or for 5  months after stopping it. Women should inform their doctor if they wish to become pregnant or think they might be pregnant. There is a potential for serious side effects to an unborn child. Talk to your health care professional or pharmacist for more information. What side effects may I notice from receiving this medicine? Side effects that you should report to your doctor or health care professional as soon as possible: -allergic reactions like skin rash, itching or hives, swelling of the face, lips, or tongue -breathing problems -chest pain -fast, irregular heartbeat -feeling faint or lightheaded, falls -fever, chills, or any other sign of infection -muscle spasms, tightening, or twitches -numbness or tingling -skin blisters or bumps, or is dry, peels, or red -slow healing or unexplained pain in the mouth or jaw -unusual bleeding or bruising Side effects that usually do not require medical attention (Report these to your doctor or health care professional if they continue or are bothersome.): -muscle pain -stomach upset, gas This list may not describe all possible side effects. Call your doctor for medical advice about side effects. You may report side effects to FDA at 1-800-FDA-1088. Where should I keep my medicine? This medicine is only given in a clinic, doctor's office, or other health care setting and will not be stored at home. NOTE: This sheet is a summary. It may not cover all possible information. If you have questions about this medicine, talk to your doctor, pharmacist, or health care provider.  2015, [employer]. (2011-12-27 12:37:47)

## 2014-11-22 ENCOUNTER — Other Ambulatory Visit: Payer: Self-pay

## 2014-11-22 DIAGNOSIS — Z1231 Encounter for screening mammogram for malignant neoplasm of breast: Secondary | ICD-10-CM

## 2014-11-26 ENCOUNTER — Ambulatory Visit
Admission: RE | Admit: 2014-11-26 | Discharge: 2014-11-26 | Disposition: A | Discharge status: Home / Self Care | Payer: Medicare Other | Source: Ambulatory Visit

## 2014-11-26 DIAGNOSIS — Z1231 Encounter for screening mammogram for malignant neoplasm of breast: Secondary | ICD-10-CM

## 2015-03-10 ENCOUNTER — Encounter: Payer: Self-pay | Admitting: Internal Medicine

## 2015-04-03 ENCOUNTER — Encounter (HOSPITAL_COMMUNITY): Payer: Self-pay

## 2015-04-03 ENCOUNTER — Ambulatory Visit (HOSPITAL_COMMUNITY)
Admission: RE | Admit: 2015-04-03 | Discharge: 2015-04-03 | Disposition: A | Discharge status: Home / Self Care | Payer: Medicare Other | Source: Ambulatory Visit | Attending: Internal Medicine | Admitting: Internal Medicine

## 2015-04-03 DIAGNOSIS — M810 Age-related osteoporosis without current pathological fracture: Secondary | ICD-10-CM | POA: Insufficient documentation

## 2015-04-03 HISTORY — DX: Nontoxic goiter, unspecified: E04.9

## 2015-04-03 MED ORDER — DENOSUMAB 60 MG/ML ~~LOC~~ SOLN
60.0000 mg | Freq: Once | SUBCUTANEOUS | Status: AC
  Administered 2015-04-03: 60 mg via SUBCUTANEOUS
  Filled 2015-04-03: qty 1

## 2015-04-03 NOTE — Discharge Instructions (Signed)
PROLIA °Denosumab injection °What is this medicine? °DENOSUMAB (den oh sue mab) slows bone breakdown. Prolia is used to treat osteoporosis in women after menopause and in men. Xgeva is used to prevent bone fractures and other bone problems caused by cancer bone metastases. Xgeva is also used to treat giant cell tumor of the bone. °This medicine may be used for other purposes; ask your health care provider or pharmacist if you have questions. °COMMON BRAND NAME(S): Prolia, XGEVA °What should I tell my health care provider before I take this medicine? °They need to know if you have any of these conditions: °-dental disease °-eczema °-infection or history of infections °-kidney disease or on dialysis °-low blood calcium or vitamin D °-malabsorption syndrome °-scheduled to have surgery or tooth extraction °-taking medicine that contains denosumab °-thyroid or parathyroid disease °-an unusual reaction to denosumab, other medicines, foods, dyes, or preservatives °-pregnant or trying to get pregnant °-breast-feeding °How should I use this medicine? °This medicine is for injection under the skin. It is given by a health care professional in a hospital or clinic setting. °If you are getting Prolia, a special MedGuide will be given to you by the pharmacist with each prescription and refill. Be sure to read this information carefully each time. °For Prolia, talk to your pediatrician regarding the use of this medicine in children. Special care may be needed. For Xgeva, talk to your pediatrician regarding the use of this medicine in children. While this drug may be prescribed for children as young as 13 years for selected conditions, precautions do apply. °Overdosage: If you think you've taken too much of this medicine contact a poison control center or emergency room at once. °Overdosage: If you think you have taken too much of this medicine contact a poison control center or emergency room at once. °NOTE: This medicine is only  for you. Do not share this medicine with others. °What if I miss a dose? °It is important not to miss your dose. Call your doctor or health care professional if you are unable to keep an appointment. °What may interact with this medicine? °Do not take this medicine with any of the following medications: °-other medicines containing denosumab °This medicine may also interact with the following medications: °-medicines that suppress the immune system °-medicines that treat cancer °-steroid medicines like prednisone or cortisone °This list may not describe all possible interactions. Give your health care provider a list of all the medicines, herbs, non-prescription drugs, or dietary supplements you use. Also tell them if you smoke, drink alcohol, or use illegal drugs. Some items may interact with your medicine. °What should I watch for while using this medicine? °Visit your doctor or health care professional for regular checks on your progress. Your doctor or health care professional may order blood tests and other tests to see how you are doing. °Call your doctor or health care professional if you get a cold or other infection while receiving this medicine. Do not treat yourself. This medicine may decrease your body's ability to fight infection. °You should make sure you get enough calcium and vitamin D while you are taking this medicine, unless your doctor tells you not to. Discuss the foods you eat and the vitamins you take with your health care professional. °See your dentist regularly. Brush and floss your teeth as directed. Before you have any dental work done, tell your dentist you are receiving this medicine. °Do not become pregnant while taking this medicine or for 5 months after   stopping it. Women should inform their doctor if they wish to become pregnant or think they might be pregnant. There is a potential for serious side effects to an unborn child. Talk to your health care professional or pharmacist for  more information. °What side effects may I notice from receiving this medicine? °Side effects that you should report to your doctor or health care professional as soon as possible: °-allergic reactions like skin rash, itching or hives, swelling of the face, lips, or tongue °-breathing problems °-chest pain °-fast, irregular heartbeat °-feeling faint or lightheaded, falls °-fever, chills, or any other sign of infection °-muscle spasms, tightening, or twitches °-numbness or tingling °-skin blisters or bumps, or is dry, peels, or red °-slow healing or unexplained pain in the mouth or jaw °-unusual bleeding or bruising °Side effects that usually do not require medical attention (Report these to your doctor or health care professional if they continue or are bothersome.): °-muscle pain °-stomach upset, gas °This list may not describe all possible side effects. Call your doctor for medical advice about side effects. You may report side effects to FDA at 1-800-FDA-1088. °Where should I keep my medicine? °This medicine is only given in a clinic, doctor's office, or other health care setting and will not be stored at home. °NOTE: This sheet is a summary. It may not cover all possible information. If you have questions about this medicine, talk to your doctor, pharmacist, or health care provider. °© 2015, [employer]. (2011-12-27 12:37:47) °Osteoporosis °Throughout your life, your body breaks down old bone and replaces it with new bone. As you get older, your body does not replace bone as quickly as it breaks it down. By the age of 30 years, most people begin to gradually lose bone because of the imbalance between bone loss and replacement. Some people lose more bone than others. Bone loss beyond a specified normal degree is considered osteoporosis.  °Osteoporosis affects the strength and durability of your bones. The inside of the ends of your bones and your flat bones, like the bones of your pelvis, look like  honeycomb, filled with tiny open spaces. As bone loss occurs, your bones become less dense. This means that the open spaces inside your bones become bigger and the walls between these spaces become thinner. This makes your bones weaker. Bones of a person with osteoporosis can become so weak that they can break (fracture) during minor accidents, such as a simple fall. °CAUSES  °The following factors have been associated with the development of osteoporosis: °· Smoking. °· Drinking more than 2 alcoholic drinks several days per week. °· Long-term use of certain medicines: °¨ Corticosteroids. °¨ Chemotherapy medicines. °¨ Thyroid medicines. °¨ Antiepileptic medicines. °¨ Gonadal hormone suppression medicine. °¨ Immunosuppression medicine. °· Being underweight. °· Lack of physical activity. °· Lack of exposure to the sun. This can lead to vitamin D deficiency. °· Certain medical conditions: °¨ Certain inflammatory bowel diseases, such as Crohn disease and ulcerative colitis. °¨ Diabetes. °¨ Hyperthyroidism. °¨ Hyperparathyroidism. °RISK FACTORS °Anyone can develop osteoporosis. However, the following factors can increase your risk of developing osteoporosis: °· Gender--Women are at higher risk than men. °· Age--Being older than 50 years increases your risk. °· Ethnicity--White and Asian people have an increased risk. °· Weight --Being extremely underweight can increase your risk of osteoporosis. °· Family history of osteoporosis--Having a family member who has developed osteoporosis can increase your risk. °SYMPTOMS  °Usually, people with osteoporosis have no symptoms.  °DIAGNOSIS  °Signs during   a physical exam that may prompt your caregiver to suspect osteoporosis include: °· Decreased height. This is usually caused by the compression of the bones that form your spine (vertebrae) because they have weakened and become fractured. °· A curving or rounding of the upper back (kyphosis). °To confirm signs of osteoporosis,  your caregiver may request a procedure that uses 2 low-dose X-ray beams with different levels of energy to measure your bone mineral density (dual-energy X-ray absorptiometry [DXA]). Also, your caregiver may check your level of vitamin D. °TREATMENT  °The goal of osteoporosis treatment is to strengthen bones in order to decrease the risk of bone fractures. There are different types of medicines available to help achieve this goal. Some of these medicines work by slowing the processes of bone loss. Some medicines work by increasing bone density. Treatment also involves making sure that your levels of calcium and vitamin D are adequate. °PREVENTION  °There are things you can do to help prevent osteoporosis. Adequate intake of calcium and vitamin D can help you achieve optimal bone mineral density. Regular exercise can also help, especially resistance and weight-bearing activities. If you smoke, quitting smoking is an important part of osteoporosis prevention. °MAKE SURE YOU: °· Understand these instructions. °· Will watch your condition. °· Will get help right away if you are not doing well or get worse. °FOR MORE INFORMATION °www.osteo.org and www.nof.org °Document Released: 04/07/2005 Document Revised: 10/23/2012 Document Reviewed: 06/12/2011 °[employer]® Patient Information ©2015 [employer]. This information is not intended to replace advice given to you by your health care provider. Make sure you discuss any questions you have with your health care provider. ° ° °

## 2015-04-03 NOTE — Progress Notes (Signed)
Uneventful injection of Prolia. Pt has received it the past. Voices no c/o. Discharged ambulatory unaccompanied to elevator.

## 2015-08-18 DIAGNOSIS — H401123 Primary open-angle glaucoma, left eye, severe stage: Secondary | ICD-10-CM | POA: Diagnosis not present

## 2015-08-18 DIAGNOSIS — H04123 Dry eye syndrome of bilateral lacrimal glands: Secondary | ICD-10-CM | POA: Diagnosis not present

## 2015-08-18 DIAGNOSIS — Z961 Presence of intraocular lens: Secondary | ICD-10-CM | POA: Diagnosis not present

## 2015-08-22 DIAGNOSIS — H409 Unspecified glaucoma: Secondary | ICD-10-CM | POA: Diagnosis not present

## 2015-08-22 DIAGNOSIS — E038 Other specified hypothyroidism: Secondary | ICD-10-CM | POA: Diagnosis not present

## 2015-08-22 DIAGNOSIS — F419 Anxiety disorder, unspecified: Secondary | ICD-10-CM | POA: Diagnosis not present

## 2015-08-22 DIAGNOSIS — Z1389 Encounter for screening for other disorder: Secondary | ICD-10-CM | POA: Diagnosis not present

## 2015-08-22 DIAGNOSIS — E559 Vitamin D deficiency, unspecified: Secondary | ICD-10-CM | POA: Diagnosis not present

## 2015-08-22 DIAGNOSIS — M810 Age-related osteoporosis without current pathological fracture: Secondary | ICD-10-CM | POA: Diagnosis not present

## 2015-08-22 DIAGNOSIS — Z6822 Body mass index (BMI) 22.0-22.9, adult: Secondary | ICD-10-CM | POA: Diagnosis not present

## 2015-08-22 DIAGNOSIS — M654 Radial styloid tenosynovitis [de Quervain]: Secondary | ICD-10-CM | POA: Diagnosis not present

## 2015-10-02 ENCOUNTER — Ambulatory Visit (HOSPITAL_COMMUNITY)
Admission: RE | Admit: 2015-10-02 | Discharge: 2015-10-02 | Disposition: A | Discharge status: Home / Self Care | Payer: Medicare Other | Source: Ambulatory Visit | Attending: Internal Medicine | Admitting: Internal Medicine

## 2015-10-02 ENCOUNTER — Encounter (HOSPITAL_COMMUNITY): Payer: Self-pay

## 2015-10-02 ENCOUNTER — Other Ambulatory Visit (HOSPITAL_COMMUNITY): Payer: Self-pay | Admitting: Internal Medicine

## 2015-10-02 DIAGNOSIS — M810 Age-related osteoporosis without current pathological fracture: Secondary | ICD-10-CM | POA: Diagnosis not present

## 2015-10-02 MED ORDER — DENOSUMAB 60 MG/ML ~~LOC~~ SOLN
60.0000 mg | Freq: Once | SUBCUTANEOUS | Status: AC
  Administered 2015-10-02: 60 mg via SUBCUTANEOUS
  Filled 2015-10-02: qty 1

## 2015-10-02 NOTE — Discharge Instructions (Signed)
Prolia °Denosumab injection °What is this medicine? °DENOSUMAB (den oh sue mab) slows bone breakdown. Prolia is used to treat osteoporosis in women after menopause and in men. Xgeva is used to prevent bone fractures and other bone problems caused by cancer bone metastases. Xgeva is also used to treat giant cell tumor of the bone. °This medicine may be used for other purposes; ask your health care provider or pharmacist if you have questions. °What should I tell my health care provider before I take this medicine? °They need to know if you have any of these conditions: °-dental disease °-eczema °-infection or history of infections °-kidney disease or on dialysis °-low blood calcium or vitamin D °-malabsorption syndrome °-scheduled to have surgery or tooth extraction °-taking medicine that contains denosumab °-thyroid or parathyroid disease °-an unusual reaction to denosumab, other medicines, foods, dyes, or preservatives °-pregnant or trying to get pregnant °-breast-feeding °How should I use this medicine? °This medicine is for injection under the skin. It is given by a health care professional in a hospital or clinic setting. °If you are getting Prolia, a special MedGuide will be given to you by the pharmacist with each prescription and refill. Be sure to read this information carefully each time. °For Prolia, talk to your pediatrician regarding the use of this medicine in children. Special care may be needed. For Xgeva, talk to your pediatrician regarding the use of this medicine in children. While this drug may be prescribed for children as young as 13 years for selected conditions, precautions do apply. °Overdosage: If you think you have taken too much of this medicine contact a poison control center or emergency room at once. °NOTE: This medicine is only for you. Do not share this medicine with others. °What if I miss a dose? °It is important not to miss your dose. Call your doctor or health care professional  if you are unable to keep an appointment. °What may interact with this medicine? °Do not take this medicine with any of the following medications: °-other medicines containing denosumab °This medicine may also interact with the following medications: °-medicines that suppress the immune system °-medicines that treat cancer °-steroid medicines like prednisone or cortisone °This list may not describe all possible interactions. Give your health care provider a list of all the medicines, herbs, non-prescription drugs, or dietary supplements you use. Also tell them if you smoke, drink alcohol, or use illegal drugs. Some items may interact with your medicine. °What should I watch for while using this medicine? °Visit your doctor or health care professional for regular checks on your progress. Your doctor or health care professional may order blood tests and other tests to see how you are doing. °Call your doctor or health care professional if you get a cold or other infection while receiving this medicine. Do not treat yourself. This medicine may decrease your body's ability to fight infection. °You should make sure you get enough calcium and vitamin D while you are taking this medicine, unless your doctor tells you not to. Discuss the foods you eat and the vitamins you take with your health care professional. °See your dentist regularly. Brush and floss your teeth as directed. Before you have any dental work done, tell your dentist you are receiving this medicine. °Do not become pregnant while taking this medicine or for 5 months after stopping it. Women should inform their doctor if they wish to become pregnant or think they might be pregnant. There is a potential for serious side   effects to an unborn child. Talk to your health care professional or pharmacist for more information. °What side effects may I notice from receiving this medicine? °Side effects that you should report to your doctor or health care professional  as soon as possible: °-allergic reactions like skin rash, itching or hives, swelling of the face, lips, or tongue °-breathing problems °-chest pain °-fast, irregular heartbeat °-feeling faint or lightheaded, falls °-fever, chills, or any other sign of infection °-muscle spasms, tightening, or twitches °-numbness or tingling °-skin blisters or bumps, or is dry, peels, or red °-slow healing or unexplained pain in the mouth or jaw °-unusual bleeding or bruising °Side effects that usually do not require medical attention (Report these to your doctor or health care professional if they continue or are bothersome.): °-muscle pain °-stomach upset, gas °This list may not describe all possible side effects. Call your doctor for medical advice about side effects. You may report side effects to FDA at 1-800-FDA-1088. °Where should I keep my medicine? °This medicine is only given in a clinic, doctor's office, or other health care setting and will not be stored at home. °NOTE: This sheet is a summary. It may not cover all possible information. If you have questions about this medicine, talk to your doctor, pharmacist, or health care provider. °  °© 2016, [employer]. (2011-12-27 12:37:47) ° °

## 2015-11-18 DIAGNOSIS — M810 Age-related osteoporosis without current pathological fracture: Secondary | ICD-10-CM | POA: Diagnosis not present

## 2015-11-18 DIAGNOSIS — N390 Urinary tract infection, site not specified: Secondary | ICD-10-CM | POA: Diagnosis not present

## 2015-11-18 DIAGNOSIS — R8299 Other abnormal findings in urine: Secondary | ICD-10-CM | POA: Diagnosis not present

## 2015-11-18 DIAGNOSIS — E038 Other specified hypothyroidism: Secondary | ICD-10-CM | POA: Diagnosis not present

## 2015-11-24 DIAGNOSIS — E559 Vitamin D deficiency, unspecified: Secondary | ICD-10-CM | POA: Diagnosis not present

## 2015-11-24 DIAGNOSIS — E038 Other specified hypothyroidism: Secondary | ICD-10-CM | POA: Diagnosis not present

## 2015-11-24 DIAGNOSIS — Z1389 Encounter for screening for other disorder: Secondary | ICD-10-CM | POA: Diagnosis not present

## 2015-11-24 DIAGNOSIS — Z0000 Encounter for general adult medical examination without abnormal findings: Secondary | ICD-10-CM | POA: Diagnosis not present

## 2015-11-24 DIAGNOSIS — M810 Age-related osteoporosis without current pathological fracture: Secondary | ICD-10-CM | POA: Diagnosis not present

## 2015-11-24 DIAGNOSIS — M654 Radial styloid tenosynovitis [de Quervain]: Secondary | ICD-10-CM | POA: Diagnosis not present

## 2015-11-24 DIAGNOSIS — H409 Unspecified glaucoma: Secondary | ICD-10-CM | POA: Diagnosis not present

## 2015-11-24 DIAGNOSIS — D126 Benign neoplasm of colon, unspecified: Secondary | ICD-10-CM | POA: Diagnosis not present

## 2015-11-24 DIAGNOSIS — F419 Anxiety disorder, unspecified: Secondary | ICD-10-CM | POA: Diagnosis not present

## 2015-11-24 DIAGNOSIS — Z6822 Body mass index (BMI) 22.0-22.9, adult: Secondary | ICD-10-CM | POA: Diagnosis not present

## 2016-02-16 DIAGNOSIS — H04123 Dry eye syndrome of bilateral lacrimal glands: Secondary | ICD-10-CM | POA: Diagnosis not present

## 2016-02-16 DIAGNOSIS — H401123 Primary open-angle glaucoma, left eye, severe stage: Secondary | ICD-10-CM | POA: Diagnosis not present

## 2016-02-16 DIAGNOSIS — H1713 Central corneal opacity, bilateral: Secondary | ICD-10-CM | POA: Diagnosis not present

## 2016-04-06 ENCOUNTER — Ambulatory Visit (HOSPITAL_COMMUNITY)
Admission: RE | Admit: 2016-04-06 | Discharge: 2016-04-06 | Disposition: A | Discharge status: Home / Self Care | Payer: Medicare Other | Source: Ambulatory Visit | Attending: Internal Medicine | Admitting: Internal Medicine

## 2016-04-06 ENCOUNTER — Other Ambulatory Visit (HOSPITAL_COMMUNITY): Payer: Self-pay | Admitting: Internal Medicine

## 2016-04-06 DIAGNOSIS — M810 Age-related osteoporosis without current pathological fracture: Secondary | ICD-10-CM | POA: Diagnosis not present

## 2016-04-06 MED ORDER — DENOSUMAB 60 MG/ML ~~LOC~~ SOLN
60.0000 mg | Freq: Once | SUBCUTANEOUS | Status: AC
  Administered 2016-04-06: 60 mg via SUBCUTANEOUS
  Filled 2016-04-06: qty 1

## 2016-04-06 NOTE — Progress Notes (Signed)
Pt here for her Prolia injection. Labs for calcium is wnl. Pt states she does not take a calclium supplement but does take a lot of dietary calclium

## 2016-04-06 NOTE — Discharge Instructions (Signed)
PROLIA °Denosumab injection °What is this medicine? °DENOSUMAB (den oh sue mab) slows bone breakdown. Prolia is used to treat osteoporosis in women after menopause and in men. Xgeva is used to prevent bone fractures and other bone problems caused by cancer bone metastases. Xgeva is also used to treat giant cell tumor of the bone. °This medicine may be used for other purposes; ask your health care provider or pharmacist if you have questions. °What should I tell my health care provider before I take this medicine? °They need to know if you have any of these conditions: °-dental disease °-eczema °-infection or history of infections °-kidney disease or on dialysis °-low blood calcium or vitamin D °-malabsorption syndrome °-scheduled to have surgery or tooth extraction °-taking medicine that contains denosumab °-thyroid or parathyroid disease °-an unusual reaction to denosumab, other medicines, foods, dyes, or preservatives °-pregnant or trying to get pregnant °-breast-feeding °How should I use this medicine? °This medicine is for injection under the skin. It is given by a health care professional in a hospital or clinic setting. °If you are getting Prolia, a special MedGuide will be given to you by the pharmacist with each prescription and refill. Be sure to read this information carefully each time. °For Prolia, talk to your pediatrician regarding the use of this medicine in children. Special care may be needed. For Xgeva, talk to your pediatrician regarding the use of this medicine in children. While this drug may be prescribed for children as young as 13 years for selected conditions, precautions do apply. °Overdosage: If you think you have taken too much of this medicine contact a poison control center or emergency room at once. °NOTE: This medicine is only for you. Do not share this medicine with others. °What if I miss a dose? °It is important not to miss your dose. Call your doctor or health care professional if  you are unable to keep an appointment. °What may interact with this medicine? °Do not take this medicine with any of the following medications: °-other medicines containing denosumab °This medicine may also interact with the following medications: °-medicines that suppress the immune system °-medicines that treat cancer °-steroid medicines like prednisone or cortisone °This list may not describe all possible interactions. Give your health care provider a list of all the medicines, herbs, non-prescription drugs, or dietary supplements you use. Also tell them if you smoke, drink alcohol, or use illegal drugs. Some items may interact with your medicine. °What should I watch for while using this medicine? °Visit your doctor or health care professional for regular checks on your progress. Your doctor or health care professional may order blood tests and other tests to see how you are doing. °Call your doctor or health care professional if you get a cold or other infection while receiving this medicine. Do not treat yourself. This medicine may decrease your body's ability to fight infection. °You should make sure you get enough calcium and vitamin D while you are taking this medicine, unless your doctor tells you not to. Discuss the foods you eat and the vitamins you take with your health care professional. °See your dentist regularly. Brush and floss your teeth as directed. Before you have any dental work done, tell your dentist you are receiving this medicine. °Do not become pregnant while taking this medicine or for 5 months after stopping it. Women should inform their doctor if they wish to become pregnant or think they might be pregnant. There is a potential for serious side   effects to an unborn child. Talk to your health care professional or pharmacist for more information. °What side effects may I notice from receiving this medicine? °Side effects that you should report to your doctor or health care professional as  soon as possible: °-allergic reactions like skin rash, itching or hives, swelling of the face, lips, or tongue °-breathing problems °-chest pain °-fast, irregular heartbeat °-feeling faint or lightheaded, falls °-fever, chills, or any other sign of infection °-muscle spasms, tightening, or twitches °-numbness or tingling °-skin blisters or bumps, or is dry, peels, or red °-slow healing or unexplained pain in the mouth or jaw °-unusual bleeding or bruising °Side effects that usually do not require medical attention (Report these to your doctor or health care professional if they continue or are bothersome.): °-muscle pain °-stomach upset, gas °This list may not describe all possible side effects. Call your doctor for medical advice about side effects. You may report side effects to FDA at 1-800-FDA-1088. °Where should I keep my medicine? °This medicine is only given in a clinic, doctor's office, or other health care setting and will not be stored at home. °NOTE: This sheet is a summary. It may not cover all possible information. If you have questions about this medicine, talk to your doctor, pharmacist, or health care provider. °  °© 2016, [employer]. (2011-12-27 12:37:47) °Osteoporosis °Osteoporosis is the thinning and loss of density in the bones. Osteoporosis makes the bones more brittle, fragile, and likely to break (fracture). Over time, osteoporosis can cause the bones to become so weak that they fracture after a simple fall. The bones most likely to fracture are the bones in the hip, wrist, and spine. °CAUSES  °The exact cause is not known. °RISK FACTORS °Anyone can develop osteoporosis. You may be at greater risk if you have a family history of the condition or have poor nutrition. You may also have a higher risk if you are:  °· Female.   °· 50 years old or older. °· A smoker. °· Not physically active.   °· White or Asian. °· Slender. °SIGNS AND SYMPTOMS  °A fracture might be the first sign of the  disease, especially if it results from a fall or injury that would not usually cause a bone to break. Other signs and symptoms include:  °· Low back and neck pain. °· Stooped posture. °· Height loss. °DIAGNOSIS  °To make a diagnosis, your health care provider may: °· Take a medical history. °· Perform a physical exam. °· Order tests, such as: °¨ A bone mineral density test. °¨ A dual-energy X-ray absorptiometry test. °TREATMENT  °The goal of osteoporosis treatment is to strengthen your bones to reduce your risk of a fracture. Treatment may involve: °· Making lifestyle changes, such as: °¨ Eating a diet rich in calcium. °¨ Doing weight-bearing and muscle-strengthening exercises. °¨ Stopping tobacco use. °¨ Limiting alcohol intake. °· Taking medicine to slow the process of bone loss or to increase bone density. °· Monitoring your levels of calcium and vitamin D. °HOME CARE INSTRUCTIONS °· Include calcium and vitamin D in your diet. Calcium is important for bone health, and vitamin D helps the body absorb calcium. °· Perform weight-bearing and muscle-strengthening exercises as directed by your health care provider. °· Do not use any tobacco products, including cigarettes, chewing tobacco, and electronic cigarettes. If you need help quitting, ask your health care provider. °· Limit your alcohol intake. °· Take medicines only as directed by your health care provider. °· Keep all   follow-up visits as directed by your health care provider. This is important. °· Take precautions at home to lower your risk of falling, such as: °¨ Keeping rooms well lit and clutter free. °¨ Installing safety rails on stairs. °¨ Using rubber mats in the bathroom and other areas that are often wet or slippery. °SEEK IMMEDIATE MEDICAL CARE IF:  °You fall or injure yourself.  °  °This information is not intended to replace advice given to you by your health care provider. Make sure you discuss any questions you have with your health care  provider. °  °Document Released: 04/07/2005 Document Revised: 07/19/2014 Document Reviewed: 12/06/2013 °[employer]vier Interactive Patient Education ©2016 [employer] ° ° °

## 2016-05-25 DIAGNOSIS — Z23 Encounter for immunization: Secondary | ICD-10-CM | POA: Diagnosis not present

## 2016-05-25 DIAGNOSIS — M810 Age-related osteoporosis without current pathological fracture: Secondary | ICD-10-CM | POA: Diagnosis not present

## 2016-05-25 DIAGNOSIS — E559 Vitamin D deficiency, unspecified: Secondary | ICD-10-CM | POA: Diagnosis not present

## 2016-05-25 DIAGNOSIS — Z6822 Body mass index (BMI) 22.0-22.9, adult: Secondary | ICD-10-CM | POA: Diagnosis not present

## 2016-05-25 DIAGNOSIS — D126 Benign neoplasm of colon, unspecified: Secondary | ICD-10-CM | POA: Diagnosis not present

## 2016-05-25 DIAGNOSIS — F419 Anxiety disorder, unspecified: Secondary | ICD-10-CM | POA: Diagnosis not present

## 2016-05-25 DIAGNOSIS — H409 Unspecified glaucoma: Secondary | ICD-10-CM | POA: Diagnosis not present

## 2016-05-25 DIAGNOSIS — E038 Other specified hypothyroidism: Secondary | ICD-10-CM | POA: Diagnosis not present

## 2016-08-16 DIAGNOSIS — H04123 Dry eye syndrome of bilateral lacrimal glands: Secondary | ICD-10-CM | POA: Diagnosis not present

## 2016-08-16 DIAGNOSIS — H401133 Primary open-angle glaucoma, bilateral, severe stage: Secondary | ICD-10-CM | POA: Diagnosis not present

## 2016-08-16 DIAGNOSIS — H1789 Other corneal scars and opacities: Secondary | ICD-10-CM | POA: Diagnosis not present

## 2016-08-16 DIAGNOSIS — H1859 Other hereditary corneal dystrophies: Secondary | ICD-10-CM | POA: Diagnosis not present

## 2016-09-27 DIAGNOSIS — H1713 Central corneal opacity, bilateral: Secondary | ICD-10-CM | POA: Diagnosis not present

## 2016-09-27 DIAGNOSIS — H401133 Primary open-angle glaucoma, bilateral, severe stage: Secondary | ICD-10-CM | POA: Diagnosis not present

## 2016-09-27 DIAGNOSIS — H1859 Other hereditary corneal dystrophies: Secondary | ICD-10-CM | POA: Diagnosis not present

## 2016-10-06 ENCOUNTER — Ambulatory Visit (HOSPITAL_COMMUNITY)
Admission: RE | Admit: 2016-10-06 | Discharge: 2016-10-06 | Disposition: A | Discharge status: Home / Self Care | Payer: Medicare Other | Source: Ambulatory Visit | Attending: Internal Medicine | Admitting: Internal Medicine

## 2016-10-06 ENCOUNTER — Encounter (HOSPITAL_COMMUNITY): Payer: Self-pay

## 2016-10-06 DIAGNOSIS — M810 Age-related osteoporosis without current pathological fracture: Secondary | ICD-10-CM | POA: Diagnosis not present

## 2016-10-06 MED ORDER — DENOSUMAB 60 MG/ML ~~LOC~~ SOLN
60.0000 mg | Freq: Once | SUBCUTANEOUS | Status: AC
  Administered 2016-10-06: 60 mg via SUBCUTANEOUS
  Filled 2016-10-06: qty 1

## 2016-10-06 NOTE — Discharge Instructions (Signed)
Denosumab injection °What is this medicine? °DENOSUMAB (den oh sue mab) slows bone breakdown. Prolia is used to treat osteoporosis in women after menopause and in men. Xgeva is used to treat a high calcium level due to cancer and to prevent bone fractures and other bone problems caused by multiple myeloma or cancer bone metastases. Xgeva is also used to treat giant cell tumor of the bone. °This medicine may be used for other purposes; ask your health care provider or pharmacist if you have questions. °COMMON BRAND NAME(S): Prolia, XGEVA °What should I tell my health care provider before I take this medicine? °They need to know if you have any of these conditions: °-dental disease °-having surgery or tooth extraction °-infection °-kidney disease °-low levels of calcium or Vitamin D in the blood °-malnutrition °-on hemodialysis °-skin conditions or sensitivity °-thyroid or parathyroid disease °-an unusual reaction to denosumab, other medicines, foods, dyes, or preservatives °-pregnant or trying to get pregnant °-breast-feeding °How should I use this medicine? °This medicine is for injection under the skin. It is given by a health care professional in a hospital or clinic setting. °If you are getting Prolia, a special MedGuide will be given to you by the pharmacist with each prescription and refill. Be sure to read this information carefully each time. °For Prolia, talk to your pediatrician regarding the use of this medicine in children. Special care may be needed. For Xgeva, talk to your pediatrician regarding the use of this medicine in children. While this drug may be prescribed for children as young as 13 years for selected conditions, precautions do apply. °Overdosage: If you think you have taken too much of this medicine contact a poison control center or emergency room at once. °NOTE: This medicine is only for you. Do not share this medicine with others. °What if I miss a dose? °It is important not to miss your  dose. Call your doctor or health care professional if you are unable to keep an appointment. °What may interact with this medicine? °Do not take this medicine with any of the following medications: °-other medicines containing denosumab °This medicine may also interact with the following medications: °-medicines that lower your chance of fighting infection °-steroid medicines like prednisone or cortisone °This list may not describe all possible interactions. Give your health care provider a list of all the medicines, herbs, non-prescription drugs, or dietary supplements you use. Also tell them if you smoke, drink alcohol, or use illegal drugs. Some items may interact with your medicine. °What should I watch for while using this medicine? °Visit your doctor or health care professional for regular checks on your progress. Your doctor or health care professional may order blood tests and other tests to see how you are doing. °Call your doctor or health care professional for advice if you get a fever, chills or sore throat, or other symptoms of a cold or flu. Do not treat yourself. This drug may decrease your body's ability to fight infection. Try to avoid being around people who are sick. °You should make sure you get enough calcium and vitamin D while you are taking this medicine, unless your doctor tells you not to. Discuss the foods you eat and the vitamins you take with your health care professional. °See your dentist regularly. Brush and floss your teeth as directed. Before you have any dental work done, tell your dentist you are receiving this medicine. °Do not become pregnant while taking this medicine or for 5 months after stopping   it. Talk with your doctor or health care professional about your birth control options while taking this medicine. Women should inform their doctor if they wish to become pregnant or think they might be pregnant. There is a potential for serious side effects to an unborn child. Talk  to your health care professional or pharmacist for more information. What side effects may I notice from receiving this medicine? Side effects that you should report to your doctor or health care professional as soon as possible: -allergic reactions like skin rash, itching or hives, swelling of the face, lips, or tongue -bone pain -breathing problems -dizziness -jaw pain, especially after dental work -redness, blistering, peeling of the skin -signs and symptoms of infection like fever or chills; cough; sore throat; pain or trouble passing urine -signs of low calcium like fast heartbeat, muscle cramps or muscle pain; pain, tingling, numbness in the hands or feet; seizures -unusual bleeding or bruising -unusually weak or tired Side effects that usually do not require medical attention (report to your doctor or health care professional if they continue or are bothersome): -constipation -diarrhea -headache -joint pain -loss of appetite -muscle pain -runny nose -tiredness -upset stomach This list may not describe all possible side effects. Call your doctor for medical advice about side effects. You may report side effects to FDA at 1-800-FDA-1088. Where should I keep my medicine? This medicine is only given in a clinic, doctor's office, or other health care setting and will not be stored at home. NOTE: This sheet is a summary. It may not cover all possible information. If you have questions about this medicine, talk to your doctor, pharmacist, or health care provider.  2018 [employer] (2016-07-20 19:17:21)

## 2016-11-19 DIAGNOSIS — N390 Urinary tract infection, site not specified: Secondary | ICD-10-CM | POA: Diagnosis not present

## 2016-11-19 DIAGNOSIS — E559 Vitamin D deficiency, unspecified: Secondary | ICD-10-CM | POA: Diagnosis not present

## 2016-11-19 DIAGNOSIS — R8299 Other abnormal findings in urine: Secondary | ICD-10-CM | POA: Diagnosis not present

## 2016-11-19 DIAGNOSIS — E038 Other specified hypothyroidism: Secondary | ICD-10-CM | POA: Diagnosis not present

## 2016-11-26 DIAGNOSIS — Z0000 Encounter for general adult medical examination without abnormal findings: Secondary | ICD-10-CM | POA: Diagnosis not present

## 2016-11-26 DIAGNOSIS — M654 Radial styloid tenosynovitis [de Quervain]: Secondary | ICD-10-CM | POA: Diagnosis not present

## 2016-11-26 DIAGNOSIS — H4089 Other specified glaucoma: Secondary | ICD-10-CM | POA: Diagnosis not present

## 2016-11-26 DIAGNOSIS — F418 Other specified anxiety disorders: Secondary | ICD-10-CM | POA: Diagnosis not present

## 2017-01-03 DIAGNOSIS — M810 Age-related osteoporosis without current pathological fracture: Secondary | ICD-10-CM | POA: Diagnosis not present

## 2017-02-14 DIAGNOSIS — H1859 Other hereditary corneal dystrophies: Secondary | ICD-10-CM | POA: Diagnosis not present

## 2017-02-14 DIAGNOSIS — H04123 Dry eye syndrome of bilateral lacrimal glands: Secondary | ICD-10-CM | POA: Diagnosis not present

## 2017-02-14 DIAGNOSIS — H1713 Central corneal opacity, bilateral: Secondary | ICD-10-CM | POA: Diagnosis not present

## 2017-02-14 DIAGNOSIS — H401133 Primary open-angle glaucoma, bilateral, severe stage: Secondary | ICD-10-CM | POA: Diagnosis not present

## 2017-04-08 ENCOUNTER — Encounter (HOSPITAL_COMMUNITY): Payer: Self-pay

## 2017-04-08 ENCOUNTER — Ambulatory Visit (HOSPITAL_COMMUNITY)
Admission: RE | Admit: 2017-04-08 | Discharge: 2017-04-08 | Disposition: A | Discharge status: Home / Self Care | Payer: Medicare Other | Source: Ambulatory Visit | Attending: Internal Medicine | Admitting: Internal Medicine

## 2017-04-08 DIAGNOSIS — M810 Age-related osteoporosis without current pathological fracture: Secondary | ICD-10-CM | POA: Diagnosis present

## 2017-04-08 MED ORDER — DENOSUMAB 60 MG/ML ~~LOC~~ SOLN
60.0000 mg | Freq: Once | SUBCUTANEOUS | Status: AC
  Administered 2017-04-08: 60 mg via SUBCUTANEOUS
  Filled 2017-04-08: qty 1

## 2017-04-08 NOTE — Discharge Instructions (Signed)
Denosumab injection / Prolia injection  °What is this medicine? °DENOSUMAB (den oh sue mab) slows bone breakdown. Prolia is used to treat osteoporosis in women after menopause and in men. Xgeva is used to treat a high calcium level due to cancer and to prevent bone fractures and other bone problems caused by multiple myeloma or cancer bone metastases. Xgeva is also used to treat giant cell tumor of the bone. °This medicine may be used for other purposes; ask your health care provider or pharmacist if you have questions. °COMMON BRAND NAME(S): Prolia, XGEVA °What should I tell my health care provider before I take this medicine? °They need to know if you have any of these conditions: °-dental disease °-having surgery or tooth extraction °-infection °-kidney disease °-low levels of calcium or Vitamin D in the blood °-malnutrition °-on hemodialysis °-skin conditions or sensitivity °-thyroid or parathyroid disease °-an unusual reaction to denosumab, other medicines, foods, dyes, or preservatives °-pregnant or trying to get pregnant °-breast-feeding °How should I use this medicine? °This medicine is for injection under the skin. It is given by a health care professional in a hospital or clinic setting. °If you are getting Prolia, a special MedGuide will be given to you by the pharmacist with each prescription and refill. Be sure to read this information carefully each time. °For Prolia, talk to your pediatrician regarding the use of this medicine in children. Special care may be needed. For Xgeva, talk to your pediatrician regarding the use of this medicine in children. While this drug may be prescribed for children as young as 13 years for selected conditions, precautions do apply. °Overdosage: If you think you have taken too much of this medicine contact a poison control center or emergency room at once. °NOTE: This medicine is only for you. Do not share this medicine with others. °What if I miss a dose? °It is  important not to miss your dose. Call your doctor or health care professional if you are unable to keep an appointment. °What may interact with this medicine? °Do not take this medicine with any of the following medications: °-other medicines containing denosumab °This medicine may also interact with the following medications: °-medicines that lower your chance of fighting infection °-steroid medicines like prednisone or cortisone °This list may not describe all possible interactions. Give your health care provider a list of all the medicines, herbs, non-prescription drugs, or dietary supplements you use. Also tell them if you smoke, drink alcohol, or use illegal drugs. Some items may interact with your medicine. °What should I watch for while using this medicine? °Visit your doctor or health care professional for regular checks on your progress. Your doctor or health care professional may order blood tests and other tests to see how you are doing. °Call your doctor or health care professional for advice if you get a fever, chills or sore throat, or other symptoms of a cold or flu. Do not treat yourself. This drug may decrease your body's ability to fight infection. Try to avoid being around people who are sick. °You should make sure you get enough calcium and vitamin D while you are taking this medicine, unless your doctor tells you not to. Discuss the foods you eat and the vitamins you take with your health care professional. °See your dentist regularly. Brush and floss your teeth as directed. Before you have any dental work done, tell your dentist you are receiving this medicine. °Do not become pregnant while taking this medicine or for   5 months after stopping it. Talk with your doctor or health care professional about your birth control options while taking this medicine. Women should inform their doctor if they wish to become pregnant or think they might be pregnant. There is a potential for serious side  effects to an unborn child. Talk to your health care professional or pharmacist for more information. °What side effects may I notice from receiving this medicine? °Side effects that you should report to your doctor or health care professional as soon as possible: °-allergic reactions like skin rash, itching or hives, swelling of the face, lips, or tongue °-bone pain °-breathing problems °-dizziness °-jaw pain, especially after dental work °-redness, blistering, peeling of the skin °-signs and symptoms of infection like fever or chills; cough; sore throat; pain or trouble passing urine °-signs of low calcium like fast heartbeat, muscle cramps or muscle pain; pain, tingling, numbness in the hands or feet; seizures °-unusual bleeding or bruising °-unusually weak or tired °Side effects that usually do not require medical attention (report to your doctor or health care professional if they continue or are bothersome): °-constipation °-diarrhea °-headache °-joint pain °-loss of appetite °-muscle pain °-runny nose °-tiredness °-upset stomach °This list may not describe all possible side effects. Call your doctor for medical advice about side effects. You may report side effects to FDA at 1-800-FDA-1088. °Where should I keep my medicine? °This medicine is only given in a clinic, doctor's office, or other health care setting and will not be stored at home. °NOTE: This sheet is a summary. It may not cover all possible information. If you have questions about this medicine, talk to your doctor, pharmacist, or health care provider. °© 2018 [employer] (2016-07-20 19:17:21) ° °

## 2017-05-23 DIAGNOSIS — F418 Other specified anxiety disorders: Secondary | ICD-10-CM | POA: Diagnosis not present

## 2017-05-23 DIAGNOSIS — D126 Benign neoplasm of colon, unspecified: Secondary | ICD-10-CM | POA: Diagnosis not present

## 2017-05-23 DIAGNOSIS — E559 Vitamin D deficiency, unspecified: Secondary | ICD-10-CM | POA: Diagnosis not present

## 2017-05-23 DIAGNOSIS — H4089 Other specified glaucoma: Secondary | ICD-10-CM | POA: Diagnosis not present

## 2017-06-09 DIAGNOSIS — R82998 Other abnormal findings in urine: Secondary | ICD-10-CM | POA: Diagnosis not present

## 2017-06-09 DIAGNOSIS — R319 Hematuria, unspecified: Secondary | ICD-10-CM | POA: Diagnosis not present

## 2017-06-09 DIAGNOSIS — D126 Benign neoplasm of colon, unspecified: Secondary | ICD-10-CM | POA: Diagnosis not present

## 2017-06-09 DIAGNOSIS — Z6822 Body mass index (BMI) 22.0-22.9, adult: Secondary | ICD-10-CM | POA: Diagnosis not present

## 2017-06-09 DIAGNOSIS — N390 Urinary tract infection, site not specified: Secondary | ICD-10-CM | POA: Diagnosis not present

## 2017-06-09 DIAGNOSIS — R109 Unspecified abdominal pain: Secondary | ICD-10-CM | POA: Diagnosis not present

## 2017-07-18 DIAGNOSIS — H401133 Primary open-angle glaucoma, bilateral, severe stage: Secondary | ICD-10-CM | POA: Diagnosis not present

## 2017-07-18 DIAGNOSIS — H1713 Central corneal opacity, bilateral: Secondary | ICD-10-CM | POA: Diagnosis not present

## 2017-07-18 DIAGNOSIS — H04123 Dry eye syndrome of bilateral lacrimal glands: Secondary | ICD-10-CM | POA: Diagnosis not present

## 2017-07-18 DIAGNOSIS — Z961 Presence of intraocular lens: Secondary | ICD-10-CM | POA: Diagnosis not present

## 2017-10-07 ENCOUNTER — Encounter (HOSPITAL_COMMUNITY): Payer: Self-pay

## 2017-10-07 ENCOUNTER — Ambulatory Visit (HOSPITAL_COMMUNITY)
Admission: RE | Admit: 2017-10-07 | Discharge: 2017-10-07 | Disposition: A | Discharge status: Home / Self Care | Payer: Medicare Other | Source: Ambulatory Visit | Attending: Internal Medicine | Admitting: Internal Medicine

## 2017-10-07 DIAGNOSIS — M810 Age-related osteoporosis without current pathological fracture: Secondary | ICD-10-CM | POA: Insufficient documentation

## 2017-10-07 MED ORDER — DENOSUMAB 60 MG/ML ~~LOC~~ SOLN
60.0000 mg | Freq: Once | SUBCUTANEOUS | Status: AC
  Administered 2017-10-07: 60 mg via SUBCUTANEOUS
  Filled 2017-10-07: qty 1

## 2017-10-07 NOTE — Discharge Instructions (Signed)
Denosumab injection °What is this medicine? °DENOSUMAB (den oh sue mab) slows bone breakdown. Prolia is used to treat osteoporosis in women after menopause and in men. Xgeva is used to treat a high calcium level due to cancer and to prevent bone fractures and other bone problems caused by multiple myeloma or cancer bone metastases. Xgeva is also used to treat giant cell tumor of the bone. °This medicine may be used for other purposes; ask your health care provider or pharmacist if you have questions. °COMMON BRAND NAME(S): Prolia, XGEVA °What should I tell my health care provider before I take this medicine? °They need to know if you have any of these conditions: °-dental disease °-having surgery or tooth extraction °-infection °-kidney disease °-low levels of calcium or Vitamin D in the blood °-malnutrition °-on hemodialysis °-skin conditions or sensitivity °-thyroid or parathyroid disease °-an unusual reaction to denosumab, other medicines, foods, dyes, or preservatives °-pregnant or trying to get pregnant °-breast-feeding °How should I use this medicine? °This medicine is for injection under the skin. It is given by a health care professional in a hospital or clinic setting. °If you are getting Prolia, a special MedGuide will be given to you by the pharmacist with each prescription and refill. Be sure to read this information carefully each time. °For Prolia, talk to your pediatrician regarding the use of this medicine in children. Special care may be needed. For Xgeva, talk to your pediatrician regarding the use of this medicine in children. While this drug may be prescribed for children as young as 13 years for selected conditions, precautions do apply. °Overdosage: If you think you have taken too much of this medicine contact a poison control center or emergency room at once. °NOTE: This medicine is only for you. Do not share this medicine with others. °What if I miss a dose? °It is important not to miss your  dose. Call your doctor or health care professional if you are unable to keep an appointment. °What may interact with this medicine? °Do not take this medicine with any of the following medications: °-other medicines containing denosumab °This medicine may also interact with the following medications: °-medicines that lower your chance of fighting infection °-steroid medicines like prednisone or cortisone °This list may not describe all possible interactions. Give your health care provider a list of all the medicines, herbs, non-prescription drugs, or dietary supplements you use. Also tell them if you smoke, drink alcohol, or use illegal drugs. Some items may interact with your medicine. °What should I watch for while using this medicine? °Visit your doctor or health care professional for regular checks on your progress. Your doctor or health care professional may order blood tests and other tests to see how you are doing. °Call your doctor or health care professional for advice if you get a fever, chills or sore throat, or other symptoms of a cold or flu. Do not treat yourself. This drug may decrease your body's ability to fight infection. Try to avoid being around people who are sick. °You should make sure you get enough calcium and vitamin D while you are taking this medicine, unless your doctor tells you not to. Discuss the foods you eat and the vitamins you take with your health care professional. °See your dentist regularly. Brush and floss your teeth as directed. Before you have any dental work done, tell your dentist you are receiving this medicine. °Do not become pregnant while taking this medicine or for 5 months after stopping   it. Talk with your doctor or health care professional about your birth control options while taking this medicine. Women should inform their doctor if they wish to become pregnant or think they might be pregnant. There is a potential for serious side effects to an unborn child. Talk  to your health care professional or pharmacist for more information. What side effects may I notice from receiving this medicine? Side effects that you should report to your doctor or health care professional as soon as possible: -allergic reactions like skin rash, itching or hives, swelling of the face, lips, or tongue -bone pain -breathing problems -dizziness -jaw pain, especially after dental work -redness, blistering, peeling of the skin -signs and symptoms of infection like fever or chills; cough; sore throat; pain or trouble passing urine -signs of low calcium like fast heartbeat, muscle cramps or muscle pain; pain, tingling, numbness in the hands or feet; seizures -unusual bleeding or bruising -unusually weak or tired Side effects that usually do not require medical attention (report to your doctor or health care professional if they continue or are bothersome): -constipation -diarrhea -headache -joint pain -loss of appetite -muscle pain -runny nose -tiredness -upset stomach This list may not describe all possible side effects. Call your doctor for medical advice about side effects. You may report side effects to FDA at 1-800-FDA-1088. Where should I keep my medicine? This medicine is only given in a clinic, doctor's office, or other health care setting and will not be stored at home. NOTE: This sheet is a summary. It may not cover all possible information. If you have questions about this medicine, talk to your doctor, pharmacist, or health care provider.  2018 [employer] (2016-07-20 19:17:21)

## 2017-11-28 DIAGNOSIS — R82998 Other abnormal findings in urine: Secondary | ICD-10-CM | POA: Diagnosis not present

## 2017-11-29 DIAGNOSIS — Z79899 Other long term (current) drug therapy: Secondary | ICD-10-CM | POA: Diagnosis not present

## 2017-11-29 DIAGNOSIS — E038 Other specified hypothyroidism: Secondary | ICD-10-CM | POA: Diagnosis not present

## 2017-11-29 DIAGNOSIS — E559 Vitamin D deficiency, unspecified: Secondary | ICD-10-CM | POA: Diagnosis not present

## 2017-12-06 DIAGNOSIS — Z0000 Encounter for general adult medical examination without abnormal findings: Secondary | ICD-10-CM | POA: Diagnosis not present

## 2017-12-06 DIAGNOSIS — H4089 Other specified glaucoma: Secondary | ICD-10-CM | POA: Diagnosis not present

## 2017-12-06 DIAGNOSIS — F418 Other specified anxiety disorders: Secondary | ICD-10-CM | POA: Diagnosis not present

## 2017-12-06 DIAGNOSIS — H9193 Unspecified hearing loss, bilateral: Secondary | ICD-10-CM | POA: Diagnosis not present

## 2018-01-19 DIAGNOSIS — H1789 Other corneal scars and opacities: Secondary | ICD-10-CM | POA: Diagnosis not present

## 2018-01-19 DIAGNOSIS — H401133 Primary open-angle glaucoma, bilateral, severe stage: Secondary | ICD-10-CM | POA: Diagnosis not present

## 2018-01-19 DIAGNOSIS — Z961 Presence of intraocular lens: Secondary | ICD-10-CM | POA: Diagnosis not present

## 2018-01-19 DIAGNOSIS — H1859 Other hereditary corneal dystrophies: Secondary | ICD-10-CM | POA: Diagnosis not present

## 2018-04-10 ENCOUNTER — Encounter (HOSPITAL_COMMUNITY): Payer: Self-pay

## 2018-04-10 ENCOUNTER — Ambulatory Visit (HOSPITAL_COMMUNITY)
Admission: RE | Admit: 2018-04-10 | Discharge: 2018-04-10 | Disposition: A | Discharge status: Home / Self Care | Payer: Medicare Other | Source: Ambulatory Visit | Attending: Internal Medicine | Admitting: Internal Medicine

## 2018-04-10 DIAGNOSIS — M810 Age-related osteoporosis without current pathological fracture: Secondary | ICD-10-CM | POA: Diagnosis not present

## 2018-04-10 MED ORDER — DENOSUMAB 60 MG/ML ~~LOC~~ SOSY
60.0000 mg | PREFILLED_SYRINGE | Freq: Once | SUBCUTANEOUS | Status: AC
  Administered 2018-04-10: 60 mg via SUBCUTANEOUS
  Filled 2018-04-10: qty 1

## 2018-04-10 NOTE — Discharge Instructions (Signed)
Denosumab injection / Prolia What is this medicine? DENOSUMAB (den oh sue mab) slows bone breakdown. Prolia is used to treat osteoporosis in women after menopause and in men. Delton See is used to treat a high calcium level due to cancer and to prevent bone fractures and other bone problems caused by multiple myeloma or cancer bone metastases. Delton See is also used to treat giant cell tumor of the bone. This medicine may be used for other purposes; ask your health care provider or pharmacist if you have questions. COMMON BRAND NAME(S): Prolia, XGEVA What should I tell my health care provider before I take this medicine? They need to know if you have any of these conditions: -dental disease -having surgery or tooth extraction -infection -kidney disease -low levels of calcium or Vitamin D in the blood -malnutrition -on hemodialysis -skin conditions or sensitivity -thyroid or parathyroid disease -an unusual reaction to denosumab, other medicines, foods, dyes, or preservatives -pregnant or trying to get pregnant -breast-feeding How should I use this medicine? This medicine is for injection under the skin. It is given by a health care professional in a hospital or clinic setting. If you are getting Prolia, a special MedGuide will be given to you by the pharmacist with each prescription and refill. Be sure to read this information carefully each time. For Prolia, talk to your pediatrician regarding the use of this medicine in children. Special care may be needed. For Delton See, talk to your pediatrician regarding the use of this medicine in children. While this drug may be prescribed for children as young as 13 years for selected conditions, precautions do apply. Overdosage: If you think you have taken too much of this medicine contact a poison control center or emergency room at once. NOTE: This medicine is only for you. Do not share this medicine with others. What if I miss a dose? It is important not to  miss your dose. Call your doctor or health care professional if you are unable to keep an appointment. What may interact with this medicine? Do not take this medicine with any of the following medications: -other medicines containing denosumab This medicine may also interact with the following medications: -medicines that lower your chance of fighting infection -steroid medicines like prednisone or cortisone This list may not describe all possible interactions. Give your health care provider a list of all the medicines, herbs, non-prescription drugs, or dietary supplements you use. Also tell them if you smoke, drink alcohol, or use illegal drugs. Some items may interact with your medicine. What should I watch for while using this medicine? Visit your doctor or health care professional for regular checks on your progress. Your doctor or health care professional may order blood tests and other tests to see how you are doing. Call your doctor or health care professional for advice if you get a fever, chills or sore throat, or other symptoms of a cold or flu. Do not treat yourself. This drug may decrease your body's ability to fight infection. Try to avoid being around people who are sick. You should make sure you get enough calcium and vitamin D while you are taking this medicine, unless your doctor tells you not to. Discuss the foods you eat and the vitamins you take with your health care professional. See your dentist regularly. Brush and floss your teeth as directed. Before you have any dental work done, tell your dentist you are receiving this medicine. Do not become pregnant while taking this medicine or for 5 months  after stopping it. Talk with your doctor or health care professional about your birth control options while taking this medicine. Women should inform their doctor if they wish to become pregnant or think they might be pregnant. There is a potential for serious side effects to an unborn  child. Talk to your health care professional or pharmacist for more information. What side effects may I notice from receiving this medicine? Side effects that you should report to your doctor or health care professional as soon as possible: -allergic reactions like skin rash, itching or hives, swelling of the face, lips, or tongue -bone pain -breathing problems -dizziness -jaw pain, especially after dental work -redness, blistering, peeling of the skin -signs and symptoms of infection like fever or chills; cough; sore throat; pain or trouble passing urine -signs of low calcium like fast heartbeat, muscle cramps or muscle pain; pain, tingling, numbness in the hands or feet; seizures -unusual bleeding or bruising -unusually weak or tired Side effects that usually do not require medical attention (report to your doctor or health care professional if they continue or are bothersome): -constipation -diarrhea -headache -joint pain -loss of appetite -muscle pain -runny nose -tiredness -upset stomach This list may not describe all possible side effects. Call your doctor for medical advice about side effects. You may report side effects to FDA at 1-800-FDA-1088. Where should I keep my medicine? This medicine is only given in a clinic, doctor's office, or other health care setting and will not be stored at home. NOTE: This sheet is a summary. It may not cover all possible information. If you have questions about this medicine, talk to your doctor, pharmacist, or health care provider.  2018 [employer] Standard (2016-07-20 19:17:21)

## 2018-06-05 DIAGNOSIS — E559 Vitamin D deficiency, unspecified: Secondary | ICD-10-CM | POA: Diagnosis not present

## 2018-06-05 DIAGNOSIS — E038 Other specified hypothyroidism: Secondary | ICD-10-CM | POA: Diagnosis not present

## 2018-06-05 DIAGNOSIS — M810 Age-related osteoporosis without current pathological fracture: Secondary | ICD-10-CM | POA: Diagnosis not present

## 2018-06-05 DIAGNOSIS — D126 Benign neoplasm of colon, unspecified: Secondary | ICD-10-CM | POA: Diagnosis not present

## 2018-07-19 DIAGNOSIS — Z961 Presence of intraocular lens: Secondary | ICD-10-CM | POA: Diagnosis not present

## 2018-07-19 DIAGNOSIS — H401133 Primary open-angle glaucoma, bilateral, severe stage: Secondary | ICD-10-CM | POA: Diagnosis not present

## 2018-07-19 DIAGNOSIS — H1859 Other hereditary corneal dystrophies: Secondary | ICD-10-CM | POA: Diagnosis not present

## 2018-10-10 ENCOUNTER — Ambulatory Visit (HOSPITAL_COMMUNITY): Payer: Medicare Other

## 2018-11-13 ENCOUNTER — Encounter (HOSPITAL_COMMUNITY): Payer: Medicare Other

## 2018-11-13 ENCOUNTER — Ambulatory Visit (HOSPITAL_COMMUNITY): Payer: Medicare Other

## 2018-12-01 DIAGNOSIS — R7989 Other specified abnormal findings of blood chemistry: Secondary | ICD-10-CM | POA: Diagnosis not present

## 2018-12-01 DIAGNOSIS — E038 Other specified hypothyroidism: Secondary | ICD-10-CM | POA: Diagnosis not present

## 2018-12-01 DIAGNOSIS — E559 Vitamin D deficiency, unspecified: Secondary | ICD-10-CM | POA: Diagnosis not present

## 2018-12-05 DIAGNOSIS — R82998 Other abnormal findings in urine: Secondary | ICD-10-CM | POA: Diagnosis not present

## 2019-01-24 DIAGNOSIS — Z961 Presence of intraocular lens: Secondary | ICD-10-CM | POA: Diagnosis not present

## 2019-01-24 DIAGNOSIS — H401133 Primary open-angle glaucoma, bilateral, severe stage: Secondary | ICD-10-CM | POA: Diagnosis not present

## 2019-01-24 DIAGNOSIS — H1789 Other corneal scars and opacities: Secondary | ICD-10-CM | POA: Diagnosis not present

## 2019-01-24 DIAGNOSIS — H04123 Dry eye syndrome of bilateral lacrimal glands: Secondary | ICD-10-CM | POA: Diagnosis not present

## 2019-03-07 DIAGNOSIS — M810 Age-related osteoporosis without current pathological fracture: Secondary | ICD-10-CM | POA: Diagnosis not present

## 2019-03-28 DIAGNOSIS — Z0120 Encounter for dental examination and cleaning without abnormal findings: Secondary | ICD-10-CM | POA: Diagnosis not present

## 2019-04-03 DIAGNOSIS — M542 Cervicalgia: Secondary | ICD-10-CM | POA: Diagnosis not present

## 2019-04-17 DIAGNOSIS — D485 Neoplasm of uncertain behavior of skin: Secondary | ICD-10-CM | POA: Diagnosis not present

## 2019-04-17 DIAGNOSIS — L820 Inflamed seborrheic keratosis: Secondary | ICD-10-CM | POA: Diagnosis not present

## 2019-04-17 DIAGNOSIS — Z23 Encounter for immunization: Secondary | ICD-10-CM | POA: Diagnosis not present

## 2019-04-21 DIAGNOSIS — Z23 Encounter for immunization: Secondary | ICD-10-CM | POA: Diagnosis not present

## 2019-06-14 DIAGNOSIS — D126 Benign neoplasm of colon, unspecified: Secondary | ICD-10-CM | POA: Diagnosis not present

## 2019-06-14 DIAGNOSIS — E039 Hypothyroidism, unspecified: Secondary | ICD-10-CM | POA: Diagnosis not present

## 2019-06-14 DIAGNOSIS — M810 Age-related osteoporosis without current pathological fracture: Secondary | ICD-10-CM | POA: Diagnosis not present

## 2019-06-14 DIAGNOSIS — E559 Vitamin D deficiency, unspecified: Secondary | ICD-10-CM | POA: Diagnosis not present

## 2019-06-18 DIAGNOSIS — E038 Other specified hypothyroidism: Secondary | ICD-10-CM | POA: Diagnosis not present

## 2019-06-18 DIAGNOSIS — E559 Vitamin D deficiency, unspecified: Secondary | ICD-10-CM | POA: Diagnosis not present

## 2019-07-30 DIAGNOSIS — H401133 Primary open-angle glaucoma, bilateral, severe stage: Secondary | ICD-10-CM | POA: Diagnosis not present

## 2019-07-30 DIAGNOSIS — H1713 Central corneal opacity, bilateral: Secondary | ICD-10-CM | POA: Diagnosis not present

## 2019-07-30 DIAGNOSIS — H04123 Dry eye syndrome of bilateral lacrimal glands: Secondary | ICD-10-CM | POA: Diagnosis not present

## 2019-07-30 DIAGNOSIS — H18593 Other hereditary corneal dystrophies, bilateral: Secondary | ICD-10-CM | POA: Diagnosis not present

## 2019-10-04 DIAGNOSIS — Z0120 Encounter for dental examination and cleaning without abnormal findings: Secondary | ICD-10-CM | POA: Diagnosis not present

## 2019-11-12 DIAGNOSIS — H1789 Other corneal scars and opacities: Secondary | ICD-10-CM | POA: Diagnosis not present

## 2019-11-12 DIAGNOSIS — Z961 Presence of intraocular lens: Secondary | ICD-10-CM | POA: Diagnosis not present

## 2019-11-12 DIAGNOSIS — H401133 Primary open-angle glaucoma, bilateral, severe stage: Secondary | ICD-10-CM | POA: Diagnosis not present

## 2019-11-12 DIAGNOSIS — H18593 Other hereditary corneal dystrophies, bilateral: Secondary | ICD-10-CM | POA: Diagnosis not present

## 2019-12-05 DIAGNOSIS — E038 Other specified hypothyroidism: Secondary | ICD-10-CM | POA: Diagnosis not present

## 2019-12-05 DIAGNOSIS — M810 Age-related osteoporosis without current pathological fracture: Secondary | ICD-10-CM | POA: Diagnosis not present

## 2019-12-05 DIAGNOSIS — Z79899 Other long term (current) drug therapy: Secondary | ICD-10-CM | POA: Diagnosis not present

## 2019-12-12 DIAGNOSIS — M810 Age-related osteoporosis without current pathological fracture: Secondary | ICD-10-CM | POA: Diagnosis not present

## 2019-12-12 DIAGNOSIS — E559 Vitamin D deficiency, unspecified: Secondary | ICD-10-CM | POA: Diagnosis not present

## 2019-12-12 DIAGNOSIS — R82998 Other abnormal findings in urine: Secondary | ICD-10-CM | POA: Diagnosis not present

## 2019-12-12 DIAGNOSIS — Z0000 Encounter for general adult medical examination without abnormal findings: Secondary | ICD-10-CM | POA: Diagnosis not present

## 2019-12-12 DIAGNOSIS — E038 Other specified hypothyroidism: Secondary | ICD-10-CM | POA: Diagnosis not present

## 2020-04-07 DIAGNOSIS — Z0120 Encounter for dental examination and cleaning without abnormal findings: Secondary | ICD-10-CM | POA: Diagnosis not present

## 2020-04-10 DIAGNOSIS — H1789 Other corneal scars and opacities: Secondary | ICD-10-CM | POA: Diagnosis not present

## 2020-04-10 DIAGNOSIS — H401133 Primary open-angle glaucoma, bilateral, severe stage: Secondary | ICD-10-CM | POA: Diagnosis not present

## 2020-04-10 DIAGNOSIS — H04123 Dry eye syndrome of bilateral lacrimal glands: Secondary | ICD-10-CM | POA: Diagnosis not present

## 2020-05-21 DIAGNOSIS — H2000 Unspecified acute and subacute iridocyclitis: Secondary | ICD-10-CM | POA: Diagnosis not present

## 2020-05-21 DIAGNOSIS — H35352 Cystoid macular degeneration, left eye: Secondary | ICD-10-CM | POA: Diagnosis not present

## 2020-05-21 DIAGNOSIS — H401123 Primary open-angle glaucoma, left eye, severe stage: Secondary | ICD-10-CM | POA: Diagnosis not present

## 2020-08-13 DIAGNOSIS — Z961 Presence of intraocular lens: Secondary | ICD-10-CM | POA: Diagnosis not present

## 2020-08-13 DIAGNOSIS — H401133 Primary open-angle glaucoma, bilateral, severe stage: Secondary | ICD-10-CM | POA: Diagnosis not present

## 2020-08-13 DIAGNOSIS — H3581 Retinal edema: Secondary | ICD-10-CM | POA: Diagnosis not present

## 2020-08-13 DIAGNOSIS — H1789 Other corneal scars and opacities: Secondary | ICD-10-CM | POA: Diagnosis not present

## 2020-09-08 DIAGNOSIS — H409 Unspecified glaucoma: Secondary | ICD-10-CM | POA: Diagnosis not present

## 2020-09-08 DIAGNOSIS — R450 Nervousness: Secondary | ICD-10-CM | POA: Diagnosis not present

## 2020-09-08 DIAGNOSIS — M810 Age-related osteoporosis without current pathological fracture: Secondary | ICD-10-CM | POA: Diagnosis not present

## 2020-09-08 DIAGNOSIS — R030 Elevated blood-pressure reading, without diagnosis of hypertension: Secondary | ICD-10-CM | POA: Diagnosis not present

## 2020-09-08 DIAGNOSIS — E039 Hypothyroidism, unspecified: Secondary | ICD-10-CM | POA: Diagnosis not present

## 2020-09-08 DIAGNOSIS — F419 Anxiety disorder, unspecified: Secondary | ICD-10-CM | POA: Diagnosis not present

## 2020-10-09 DIAGNOSIS — E039 Hypothyroidism, unspecified: Secondary | ICD-10-CM | POA: Diagnosis not present

## 2020-10-29 DIAGNOSIS — Z961 Presence of intraocular lens: Secondary | ICD-10-CM | POA: Diagnosis not present

## 2020-10-29 DIAGNOSIS — H43813 Vitreous degeneration, bilateral: Secondary | ICD-10-CM | POA: Diagnosis not present

## 2020-10-29 DIAGNOSIS — H35352 Cystoid macular degeneration, left eye: Secondary | ICD-10-CM | POA: Diagnosis not present

## 2020-10-29 DIAGNOSIS — H401122 Primary open-angle glaucoma, left eye, moderate stage: Secondary | ICD-10-CM | POA: Diagnosis not present

## 2020-11-08 DIAGNOSIS — M810 Age-related osteoporosis without current pathological fracture: Secondary | ICD-10-CM | POA: Diagnosis not present

## 2020-11-08 DIAGNOSIS — E039 Hypothyroidism, unspecified: Secondary | ICD-10-CM | POA: Diagnosis not present

## 2020-12-11 DIAGNOSIS — Z0000 Encounter for general adult medical examination without abnormal findings: Secondary | ICD-10-CM | POA: Diagnosis not present

## 2020-12-11 DIAGNOSIS — E039 Hypothyroidism, unspecified: Secondary | ICD-10-CM | POA: Diagnosis not present

## 2020-12-11 DIAGNOSIS — E559 Vitamin D deficiency, unspecified: Secondary | ICD-10-CM | POA: Diagnosis not present

## 2020-12-17 DIAGNOSIS — R82998 Other abnormal findings in urine: Secondary | ICD-10-CM | POA: Diagnosis not present

## 2021-02-08 DIAGNOSIS — E039 Hypothyroidism, unspecified: Secondary | ICD-10-CM | POA: Diagnosis not present

## 2021-02-08 DIAGNOSIS — M810 Age-related osteoporosis without current pathological fracture: Secondary | ICD-10-CM | POA: Diagnosis not present

## 2021-05-06 DIAGNOSIS — H18593 Other hereditary corneal dystrophies, bilateral: Secondary | ICD-10-CM | POA: Diagnosis not present

## 2021-05-06 DIAGNOSIS — H401122 Primary open-angle glaucoma, left eye, moderate stage: Secondary | ICD-10-CM | POA: Diagnosis not present

## 2021-05-06 DIAGNOSIS — H04123 Dry eye syndrome of bilateral lacrimal glands: Secondary | ICD-10-CM | POA: Diagnosis not present

## 2021-05-06 DIAGNOSIS — H1789 Other corneal scars and opacities: Secondary | ICD-10-CM | POA: Diagnosis not present

## 2021-05-11 DIAGNOSIS — F419 Anxiety disorder, unspecified: Secondary | ICD-10-CM | POA: Diagnosis not present

## 2021-05-11 DIAGNOSIS — E039 Hypothyroidism, unspecified: Secondary | ICD-10-CM | POA: Diagnosis not present

## 2021-05-11 DIAGNOSIS — M810 Age-related osteoporosis without current pathological fracture: Secondary | ICD-10-CM | POA: Diagnosis not present

## 2021-06-19 DIAGNOSIS — E039 Hypothyroidism, unspecified: Secondary | ICD-10-CM | POA: Diagnosis not present

## 2021-06-19 DIAGNOSIS — M810 Age-related osteoporosis without current pathological fracture: Secondary | ICD-10-CM | POA: Diagnosis not present

## 2021-06-19 DIAGNOSIS — R030 Elevated blood-pressure reading, without diagnosis of hypertension: Secondary | ICD-10-CM | POA: Diagnosis not present

## 2021-06-19 DIAGNOSIS — F419 Anxiety disorder, unspecified: Secondary | ICD-10-CM | POA: Diagnosis not present

## 2021-08-09 DIAGNOSIS — F419 Anxiety disorder, unspecified: Secondary | ICD-10-CM | POA: Diagnosis not present

## 2021-08-09 DIAGNOSIS — M810 Age-related osteoporosis without current pathological fracture: Secondary | ICD-10-CM | POA: Diagnosis not present

## 2021-08-09 DIAGNOSIS — E039 Hypothyroidism, unspecified: Secondary | ICD-10-CM | POA: Diagnosis not present

## 2021-09-08 DIAGNOSIS — E039 Hypothyroidism, unspecified: Secondary | ICD-10-CM | POA: Diagnosis not present

## 2021-09-08 DIAGNOSIS — F419 Anxiety disorder, unspecified: Secondary | ICD-10-CM | POA: Diagnosis not present

## 2021-09-08 DIAGNOSIS — M810 Age-related osteoporosis without current pathological fracture: Secondary | ICD-10-CM | POA: Diagnosis not present

## 2021-09-22 DIAGNOSIS — H401134 Primary open-angle glaucoma, bilateral, indeterminate stage: Secondary | ICD-10-CM | POA: Diagnosis not present

## 2021-09-22 DIAGNOSIS — H02102 Unspecified ectropion of right lower eyelid: Secondary | ICD-10-CM | POA: Diagnosis not present

## 2021-09-22 DIAGNOSIS — H18593 Other hereditary corneal dystrophies, bilateral: Secondary | ICD-10-CM | POA: Diagnosis not present

## 2021-09-22 DIAGNOSIS — H43813 Vitreous degeneration, bilateral: Secondary | ICD-10-CM | POA: Diagnosis not present

## 2021-12-14 DIAGNOSIS — E559 Vitamin D deficiency, unspecified: Secondary | ICD-10-CM | POA: Diagnosis not present

## 2021-12-14 DIAGNOSIS — R030 Elevated blood-pressure reading, without diagnosis of hypertension: Secondary | ICD-10-CM | POA: Diagnosis not present

## 2021-12-14 DIAGNOSIS — E039 Hypothyroidism, unspecified: Secondary | ICD-10-CM | POA: Diagnosis not present

## 2021-12-14 DIAGNOSIS — F419 Anxiety disorder, unspecified: Secondary | ICD-10-CM | POA: Diagnosis not present

## 2021-12-21 DIAGNOSIS — E039 Hypothyroidism, unspecified: Secondary | ICD-10-CM | POA: Diagnosis not present

## 2021-12-21 DIAGNOSIS — M810 Age-related osteoporosis without current pathological fracture: Secondary | ICD-10-CM | POA: Diagnosis not present

## 2021-12-21 DIAGNOSIS — Z0000 Encounter for general adult medical examination without abnormal findings: Secondary | ICD-10-CM | POA: Diagnosis not present

## 2021-12-21 DIAGNOSIS — R82998 Other abnormal findings in urine: Secondary | ICD-10-CM | POA: Diagnosis not present

## 2021-12-21 DIAGNOSIS — F419 Anxiety disorder, unspecified: Secondary | ICD-10-CM | POA: Diagnosis not present

## 2021-12-21 DIAGNOSIS — R030 Elevated blood-pressure reading, without diagnosis of hypertension: Secondary | ICD-10-CM | POA: Diagnosis not present

## 2022-03-16 DIAGNOSIS — H401134 Primary open-angle glaucoma, bilateral, indeterminate stage: Secondary | ICD-10-CM | POA: Diagnosis not present

## 2022-04-30 DIAGNOSIS — H524 Presbyopia: Secondary | ICD-10-CM | POA: Diagnosis not present

## 2022-05-13 DIAGNOSIS — H524 Presbyopia: Secondary | ICD-10-CM | POA: Diagnosis not present

## 2022-06-23 DIAGNOSIS — E039 Hypothyroidism, unspecified: Secondary | ICD-10-CM | POA: Diagnosis not present

## 2022-06-23 DIAGNOSIS — E559 Vitamin D deficiency, unspecified: Secondary | ICD-10-CM | POA: Diagnosis not present

## 2022-06-23 DIAGNOSIS — R030 Elevated blood-pressure reading, without diagnosis of hypertension: Secondary | ICD-10-CM | POA: Diagnosis not present

## 2022-06-23 DIAGNOSIS — M810 Age-related osteoporosis without current pathological fracture: Secondary | ICD-10-CM | POA: Diagnosis not present

## 2022-07-30 ENCOUNTER — Emergency Department (HOSPITAL_COMMUNITY): Payer: Medicare Other

## 2022-07-30 ENCOUNTER — Encounter (HOSPITAL_COMMUNITY): Payer: Self-pay

## 2022-07-30 ENCOUNTER — Inpatient Hospital Stay (HOSPITAL_COMMUNITY)
Admission: EM | Admit: 2022-07-30 | Discharge: 2022-08-02 | DRG: 177 | Disposition: A | Discharge status: Home Health | Payer: Medicare Other | Source: Other Acute Inpatient Hospital | Attending: Obstetrics and Gynecology | Admitting: Obstetrics and Gynecology

## 2022-07-30 ENCOUNTER — Other Ambulatory Visit: Payer: Self-pay

## 2022-07-30 DIAGNOSIS — M810 Age-related osteoporosis without current pathological fracture: Secondary | ICD-10-CM | POA: Diagnosis present

## 2022-07-30 DIAGNOSIS — R296 Repeated falls: Secondary | ICD-10-CM | POA: Diagnosis not present

## 2022-07-30 DIAGNOSIS — I10 Essential (primary) hypertension: Secondary | ICD-10-CM | POA: Diagnosis not present

## 2022-07-30 DIAGNOSIS — R7989 Other specified abnormal findings of blood chemistry: Secondary | ICD-10-CM | POA: Diagnosis not present

## 2022-07-30 DIAGNOSIS — Y92239 Unspecified place in hospital as the place of occurrence of the external cause: Secondary | ICD-10-CM | POA: Diagnosis not present

## 2022-07-30 DIAGNOSIS — E039 Hypothyroidism, unspecified: Secondary | ICD-10-CM | POA: Diagnosis not present

## 2022-07-30 DIAGNOSIS — Z79890 Hormone replacement therapy (postmenopausal): Secondary | ICD-10-CM | POA: Diagnosis not present

## 2022-07-30 DIAGNOSIS — E876 Hypokalemia: Secondary | ICD-10-CM | POA: Diagnosis not present

## 2022-07-30 DIAGNOSIS — R030 Elevated blood-pressure reading, without diagnosis of hypertension: Secondary | ICD-10-CM | POA: Diagnosis not present

## 2022-07-30 DIAGNOSIS — T380X5A Adverse effect of glucocorticoids and synthetic analogues, initial encounter: Secondary | ICD-10-CM | POA: Diagnosis not present

## 2022-07-30 DIAGNOSIS — Y92009 Unspecified place in unspecified non-institutional (private) residence as the place of occurrence of the external cause: Secondary | ICD-10-CM | POA: Diagnosis not present

## 2022-07-30 DIAGNOSIS — U071 COVID-19: Secondary | ICD-10-CM | POA: Diagnosis not present

## 2022-07-30 DIAGNOSIS — J9811 Atelectasis: Secondary | ICD-10-CM | POA: Diagnosis not present

## 2022-07-30 DIAGNOSIS — H5703 Miosis: Secondary | ICD-10-CM | POA: Diagnosis not present

## 2022-07-30 DIAGNOSIS — R0902 Hypoxemia: Secondary | ICD-10-CM | POA: Diagnosis not present

## 2022-07-30 DIAGNOSIS — I2489 Other forms of acute ischemic heart disease: Secondary | ICD-10-CM | POA: Diagnosis present

## 2022-07-30 DIAGNOSIS — W1809XA Striking against other object with subsequent fall, initial encounter: Secondary | ICD-10-CM | POA: Diagnosis not present

## 2022-07-30 DIAGNOSIS — S0990XA Unspecified injury of head, initial encounter: Secondary | ICD-10-CM | POA: Diagnosis not present

## 2022-07-30 DIAGNOSIS — R531 Weakness: Secondary | ICD-10-CM | POA: Diagnosis not present

## 2022-07-30 DIAGNOSIS — R059 Cough, unspecified: Secondary | ICD-10-CM | POA: Diagnosis not present

## 2022-07-30 DIAGNOSIS — S40012A Contusion of left shoulder, initial encounter: Secondary | ICD-10-CM | POA: Diagnosis present

## 2022-07-30 DIAGNOSIS — R5383 Other fatigue: Secondary | ICD-10-CM | POA: Diagnosis not present

## 2022-07-30 DIAGNOSIS — Z885 Allergy status to narcotic agent status: Secondary | ICD-10-CM

## 2022-07-30 DIAGNOSIS — J9601 Acute respiratory failure with hypoxia: Secondary | ICD-10-CM | POA: Diagnosis present

## 2022-07-30 DIAGNOSIS — R4182 Altered mental status, unspecified: Secondary | ICD-10-CM | POA: Diagnosis not present

## 2022-07-30 DIAGNOSIS — H5704 Mydriasis: Secondary | ICD-10-CM | POA: Diagnosis not present

## 2022-07-30 DIAGNOSIS — R051 Acute cough: Secondary | ICD-10-CM | POA: Diagnosis not present

## 2022-07-30 DIAGNOSIS — Z7983 Long term (current) use of bisphosphonates: Secondary | ICD-10-CM

## 2022-07-30 DIAGNOSIS — R262 Difficulty in walking, not elsewhere classified: Secondary | ICD-10-CM | POA: Diagnosis not present

## 2022-07-30 HISTORY — DX: Unspecified macular degeneration: H35.30

## 2022-07-30 LAB — CBC
HCT: 36.4 % (ref 36.0–46.0)
Hemoglobin: 12.2 g/dL (ref 12.0–15.0)
MCH: 31 pg (ref 26.0–34.0)
MCHC: 33.5 g/dL (ref 30.0–36.0)
MCV: 92.6 fL (ref 80.0–100.0)
Platelets: 135 10*3/uL — ABNORMAL LOW (ref 150–400)
RBC: 3.93 MIL/uL (ref 3.87–5.11)
RDW: 12.4 % (ref 11.5–15.5)
WBC: 4.8 10*3/uL (ref 4.0–10.5)
nRBC: 0 % (ref 0.0–0.2)

## 2022-07-30 LAB — URINALYSIS, ROUTINE W REFLEX MICROSCOPIC
Bilirubin Urine: NEGATIVE
Glucose, UA: NEGATIVE mg/dL
Hgb urine dipstick: NEGATIVE
Ketones, ur: 5 mg/dL — AB
Leukocytes,Ua: NEGATIVE
Nitrite: NEGATIVE
Protein, ur: 100 mg/dL — AB
Specific Gravity, Urine: 1.019 (ref 1.005–1.030)
pH: 5 (ref 5.0–8.0)

## 2022-07-30 LAB — COMPREHENSIVE METABOLIC PANEL
ALT: 19 U/L (ref 0–44)
AST: 51 U/L — ABNORMAL HIGH (ref 15–41)
Albumin: 3 g/dL — ABNORMAL LOW (ref 3.5–5.0)
Alkaline Phosphatase: 43 U/L (ref 38–126)
Anion gap: 10 (ref 5–15)
BUN: 23 mg/dL (ref 8–23)
CO2: 21 mmol/L — ABNORMAL LOW (ref 22–32)
Calcium: 8.6 mg/dL — ABNORMAL LOW (ref 8.9–10.3)
Chloride: 102 mmol/L (ref 98–111)
Creatinine, Ser: 1 mg/dL (ref 0.44–1.00)
GFR, Estimated: 52 mL/min — ABNORMAL LOW (ref >60)
Glucose, Bld: 186 mg/dL — ABNORMAL HIGH (ref 70–99)
Potassium: 3.3 mmol/L — ABNORMAL LOW (ref 3.5–5.1)
Sodium: 133 mmol/L — ABNORMAL LOW (ref 135–145)
Total Bilirubin: 0.5 mg/dL (ref 0.3–1.2)
Total Protein: 6.4 g/dL — ABNORMAL LOW (ref 6.5–8.1)

## 2022-07-30 LAB — LIPASE, BLOOD: Lipase: 46 U/L (ref 11–51)

## 2022-07-30 LAB — RESP PANEL BY RT-PCR (RSV, FLU A&B, COVID)  RVPGX2
Influenza A by PCR: NEGATIVE
Influenza B by PCR: NEGATIVE
Resp Syncytial Virus by PCR: NEGATIVE
SARS Coronavirus 2 by RT PCR: POSITIVE — AB

## 2022-07-30 NOTE — ED Provider Triage Note (Addendum)
Emergency Medicine Provider Triage Evaluation Note  Kari Garcia , a 89 y.o. female  was evaluated in triage.  Pt complains of weakness.   Cough for two weeks, sinus sx as well.  No dysuria F/Urg  She lives independently currently (alone)  Review of Systems  Positive: Fatigue, weakness, dec appetite  Negative: Fever   Physical Exam  BP (!) 193/83 (BP Location: Right Arm)   Pulse 77   Temp 98.4 F (36.9 C) (Oral)   Resp 18   Ht 4' 11.5" (1.511 m)   Wt 45.8 kg   SpO2 95%   BMI 20.06 kg/m  Gen:   Awake, no distress   Resp:  Normal effort  MSK:   Moves extremities without difficulty Other:  Moves all 4 extremities, pupil size discrepancy present, smile symmetric.   Medical Decision Making  Medically screening exam initiated at 10:18 PM.  Appropriate orders placed.  Larinda Herter was informed that the remainder of the evaluation will be completed by another provider, this initial triage assessment does not replace that evaluation, and the importance of remaining in the ED until their evaluation is complete.  Labs, cxr, urine    Tedd Sias, [place] 07/30/22 2220    Tedd Sias, [place] 07/30/22 2224

## 2022-07-30 NOTE — ED Triage Notes (Signed)
Pt's family member states the prior to the falls pt has been lying in the bed and not eating well for the past few days. Pt reports some cough at night time, nausea, and diarrhea.

## 2022-07-30 NOTE — ED Triage Notes (Signed)
Patient arrived with EMS from a local urgent care reports multiple falls for the past 2 days , denies injury / no anticoagulant medication, family also noticed tremors today . CBG= 134.

## 2022-07-31 ENCOUNTER — Other Ambulatory Visit (HOSPITAL_COMMUNITY): Payer: Medicare Other

## 2022-07-31 ENCOUNTER — Observation Stay (HOSPITAL_COMMUNITY): Payer: Medicare Other

## 2022-07-31 DIAGNOSIS — R7989 Other specified abnormal findings of blood chemistry: Secondary | ICD-10-CM | POA: Diagnosis not present

## 2022-07-31 DIAGNOSIS — R296 Repeated falls: Secondary | ICD-10-CM | POA: Diagnosis not present

## 2022-07-31 DIAGNOSIS — R059 Cough, unspecified: Secondary | ICD-10-CM | POA: Diagnosis not present

## 2022-07-31 DIAGNOSIS — E039 Hypothyroidism, unspecified: Secondary | ICD-10-CM | POA: Diagnosis present

## 2022-07-31 DIAGNOSIS — J9811 Atelectasis: Secondary | ICD-10-CM | POA: Diagnosis not present

## 2022-07-31 DIAGNOSIS — U071 COVID-19: Secondary | ICD-10-CM | POA: Diagnosis present

## 2022-07-31 DIAGNOSIS — R531 Weakness: Secondary | ICD-10-CM

## 2022-07-31 DIAGNOSIS — J9601 Acute respiratory failure with hypoxia: Secondary | ICD-10-CM | POA: Diagnosis not present

## 2022-07-31 DIAGNOSIS — R030 Elevated blood-pressure reading, without diagnosis of hypertension: Secondary | ICD-10-CM | POA: Diagnosis not present

## 2022-07-31 DIAGNOSIS — E876 Hypokalemia: Secondary | ICD-10-CM | POA: Diagnosis present

## 2022-07-31 LAB — C-REACTIVE PROTEIN: CRP: 0.8 mg/dL (ref <1.0)

## 2022-07-31 LAB — D-DIMER, QUANTITATIVE: D-Dimer, Quant: 11.39 ug/mL-FEU — ABNORMAL HIGH (ref 0.00–0.50)

## 2022-07-31 LAB — FERRITIN: Ferritin: 94 ng/mL (ref 11–307)

## 2022-07-31 LAB — HEPATITIS B SURFACE ANTIGEN: Hepatitis B Surface Ag: NONREACTIVE

## 2022-07-31 LAB — MAGNESIUM: Magnesium: 1.5 mg/dL — ABNORMAL LOW (ref 1.7–2.4)

## 2022-07-31 LAB — PROCALCITONIN: Procalcitonin: <0.1 ng/mL

## 2022-07-31 LAB — TROPONIN I (HIGH SENSITIVITY)
Troponin I (High Sensitivity): 579 ng/L (ref <18)
Troponin I (High Sensitivity): 368 ng/L (ref <18)

## 2022-07-31 LAB — PHOSPHORUS: Phosphorus: 3 mg/dL (ref 2.5–4.6)

## 2022-07-31 LAB — LACTIC ACID, PLASMA
Lactic Acid, Venous: 1.3 mmol/L (ref 0.5–1.9)
Lactic Acid, Venous: 0.8 mmol/L (ref 0.5–1.9)

## 2022-07-31 LAB — TSH: TSH: 1.006 u[IU]/mL (ref 0.350–4.500)

## 2022-07-31 LAB — LACTATE DEHYDROGENASE: LDH: 308 U/L — ABNORMAL HIGH (ref 98–192)

## 2022-07-31 LAB — BRAIN NATRIURETIC PEPTIDE: B Natriuretic Peptide: 509.1 pg/mL — ABNORMAL HIGH (ref 0.0–100.0)

## 2022-07-31 MED ORDER — ONDANSETRON HCL 4 MG PO TABS: 4.0000 mg | ORAL_TABLET | Freq: Four times a day (QID) | ORAL | Status: DC | PRN

## 2022-07-31 MED ORDER — POTASSIUM CHLORIDE CRYS ER 20 MEQ PO TBCR
40.0000 meq | EXTENDED_RELEASE_TABLET | ORAL | Status: AC
  Administered 2022-07-31: 40 meq via ORAL
  Filled 2022-07-31: qty 2

## 2022-07-31 MED ORDER — ACETAMINOPHEN 325 MG PO TABS: 650.0000 mg | ORAL_TABLET | Freq: Four times a day (QID) | ORAL | Status: DC | PRN

## 2022-07-31 MED ORDER — MAGNESIUM SULFATE 2 GM/50ML IV SOLN
2.0000 g | Freq: Once | INTRAVENOUS | Status: AC
  Administered 2022-07-31: 2 g via INTRAVENOUS
  Filled 2022-07-31: qty 50

## 2022-07-31 MED ORDER — ALBUTEROL SULFATE HFA 108 (90 BASE) MCG/ACT IN AERS
2.0000 | INHALATION_SPRAY | Freq: Four times a day (QID) | RESPIRATORY_TRACT | Status: DC
  Administered 2022-07-31 – 2022-08-01 (×5): 2 via RESPIRATORY_TRACT
  Filled 2022-07-31 (×2): qty 6.7

## 2022-07-31 MED ORDER — DORZOLAMIDE HCL 2 % OP SOLN: 1.0000 [drp] | Freq: Two times a day (BID) | OPHTHALMIC | Status: DC

## 2022-07-31 MED ORDER — SODIUM CHLORIDE 0.9 % IV SOLN: INTRAVENOUS | Status: AC

## 2022-07-31 MED ORDER — TIMOLOL MALEATE 0.5 % OP SOLN
1.0000 [drp] | Freq: Two times a day (BID) | OPHTHALMIC | Status: DC
  Administered 2022-08-01 – 2022-08-02 (×3): 1 [drp] via OPHTHALMIC
  Filled 2022-07-31: qty 5

## 2022-07-31 MED ORDER — LEVOTHYROXINE SODIUM 25 MCG PO TABS
25.0000 ug | ORAL_TABLET | Freq: Every day | ORAL | Status: DC
  Administered 2022-07-31 – 2022-08-02 (×3): 25 ug via ORAL
  Filled 2022-07-31 (×3): qty 1

## 2022-07-31 MED ORDER — GUAIFENESIN-DM 100-10 MG/5ML PO SYRP
10.0000 mL | ORAL_SOLUTION | ORAL | Status: DC | PRN
  Administered 2022-07-31: 10 mL via ORAL
  Filled 2022-07-31: qty 10

## 2022-07-31 MED ORDER — SODIUM CHLORIDE 0.9% FLUSH
3.0000 mL | Freq: Two times a day (BID) | INTRAVENOUS | Status: DC
  Administered 2022-07-31 – 2022-08-02 (×4): 3 mL via INTRAVENOUS

## 2022-07-31 MED ORDER — SODIUM CHLORIDE 0.9 % IV SOLN
200.0000 mg | Freq: Once | INTRAVENOUS | Status: AC
  Administered 2022-07-31: 200 mg via INTRAVENOUS
  Filled 2022-07-31: qty 40

## 2022-07-31 MED ORDER — TIMOLOL HEMIHYDRATE 0.5 % OP SOLN: 1.0000 [drp] | Freq: Two times a day (BID) | OPHTHALMIC | Status: DC

## 2022-07-31 MED ORDER — FAMOTIDINE 20 MG PO TABS
20.0000 mg | ORAL_TABLET | Freq: Two times a day (BID) | ORAL | Status: DC
  Administered 2022-07-31 – 2022-08-01 (×3): 20 mg via ORAL
  Filled 2022-07-31 (×3): qty 1

## 2022-07-31 MED ORDER — ONDANSETRON HCL 4 MG/2ML IJ SOLN: 4.0000 mg | Freq: Four times a day (QID) | INTRAMUSCULAR | Status: DC | PRN

## 2022-07-31 MED ORDER — DEXAMETHASONE 6 MG PO TABS
6.0000 mg | ORAL_TABLET | ORAL | Status: DC
  Administered 2022-07-31 – 2022-08-02 (×3): 6 mg via ORAL
  Filled 2022-07-31: qty 1
  Filled 2022-07-31: qty 2
  Filled 2022-07-31: qty 1

## 2022-07-31 MED ORDER — ZINC SULFATE 220 (50 ZN) MG PO CAPS
220.0000 mg | ORAL_CAPSULE | Freq: Every day | ORAL | Status: DC
  Administered 2022-07-31 – 2022-08-02 (×3): 220 mg via ORAL
  Filled 2022-07-31 (×3): qty 1

## 2022-07-31 MED ORDER — ENOXAPARIN SODIUM 30 MG/0.3ML IJ SOSY
30.0000 mg | PREFILLED_SYRINGE | INTRAMUSCULAR | Status: DC
  Administered 2022-07-31 – 2022-08-02 (×3): 30 mg via SUBCUTANEOUS
  Filled 2022-07-31 (×3): qty 0.3

## 2022-07-31 MED ORDER — VITAMIN C 500 MG PO TABS
500.0000 mg | ORAL_TABLET | Freq: Every day | ORAL | Status: DC
  Administered 2022-07-31 – 2022-08-02 (×3): 500 mg via ORAL
  Filled 2022-07-31 (×3): qty 1

## 2022-07-31 MED ORDER — GUAIFENESIN ER 600 MG PO TB12
600.0000 mg | ORAL_TABLET | Freq: Two times a day (BID) | ORAL | Status: DC
  Administered 2022-07-31 – 2022-08-02 (×5): 600 mg via ORAL
  Filled 2022-07-31 (×5): qty 1

## 2022-07-31 MED ORDER — DORZOLAMIDE HCL 2 % OP SOLN
1.0000 [drp] | Freq: Two times a day (BID) | OPHTHALMIC | Status: DC
  Administered 2022-08-01 – 2022-08-02 (×2): 1 [drp] via OPHTHALMIC
  Filled 2022-07-31 (×2): qty 10

## 2022-07-31 MED ORDER — IOHEXOL 350 MG/ML SOLN
75.0000 mL | Freq: Once | INTRAVENOUS | Status: AC | PRN
  Administered 2022-07-31: 75 mL via INTRAVENOUS

## 2022-07-31 MED ORDER — SODIUM CHLORIDE 0.9 % IV SOLN
100.0000 mg | Freq: Every day | INTRAVENOUS | Status: DC
  Administered 2022-08-01: 100 mg via INTRAVENOUS
  Filled 2022-07-31: qty 20

## 2022-07-31 NOTE — ED Notes (Signed)
Cleaned patient up placed a brief and a pad down patient is resting with family and call bell in reach

## 2022-07-31 NOTE — ED Provider Notes (Signed)
[facility] Provider Note   CSN: [id number] Arrival date & time: 07/30/22  1943     History  Chief Complaint  Patient presents with   Multiple Falls / Gen. Weakness   Weakness   Fall    Kari Garcia is a 89 y.o. female.  Patient presents to the emergency department for generalized weakness.  Has had some cough, nasal congestion ongoing for more than a week.  In the last 3 days, however, she has gotten sicker and has had severe weakness.  She has fallen 4 times in the last 24 hours.  Normally she can ambulate without assistive devices, even still mows her own lawn.  This is a big change for her.       Home Medications Prior to Admission medications   Medication Sig Start Date End Date Taking? Authorizing Provider  acetaminophen (TYLENOL) 500 MG tablet Take 500 mg by mouth every 6 (six) hours as needed.    [provider]  calcium citrate-vitamin D (CITRACAL+D) 315-200 MG-UNIT per tablet Take 1 tablet by mouth 2 (two) times daily.    [provider]  denosumab (PROLIA) 60 MG/ML SOLN injection Inject 60 mg into the skin every 6 (six) months. Administer in upper arm, thigh, or abdomen    [provider]  levothyroxine (SYNTHROID, LEVOTHROID) 75 MCG tablet Take 25 mcg by mouth daily before breakfast.     [provider]  timolol (BETIMOL) 0.5 % ophthalmic solution 1 drop 2 (two) times daily.    [provider]  Vitamin D, Ergocalciferol, (DRISDOL) 50000 UNITS CAPS capsule Take 50,000 Units by mouth every 7 (seven) days.    [provider]      Allergies    Codeine    Review of Systems   Review of Systems  Physical Exam Updated Vital Signs BP (!) 143/75 (BP Location: Left Arm)   Pulse 73   Temp 98.9 F (37.2 C)   Resp 18   Ht 4' 11.5" (1.511 m)   Wt 45.8 kg   SpO2 95%   BMI 20.06 kg/m  Physical Exam Vitals and nursing note reviewed.  Constitutional:      General: She  is not in acute distress.    Appearance: She is well-developed.  HENT:     Head: Normocephalic and atraumatic.     Mouth/Throat:     Mouth: Mucous membranes are moist.  Eyes:     General: Vision grossly intact. Gaze aligned appropriately.     Extraocular Movements: Extraocular movements intact.     Conjunctiva/sclera: Conjunctivae normal.  Cardiovascular:     Rate and Rhythm: Normal rate and regular rhythm.     Pulses: Normal pulses.     Heart sounds: Normal heart sounds, S1 normal and S2 normal. No murmur heard.    No friction rub. No gallop.  Pulmonary:     Effort: Pulmonary effort is normal. No respiratory distress.     Breath sounds: Normal breath sounds.  Abdominal:     General: Bowel sounds are normal.     Palpations: Abdomen is soft.     Tenderness: There is no abdominal tenderness. There is no guarding or rebound.     Hernia: No hernia is present.  Musculoskeletal:        General: No swelling.     Cervical back: Full passive range of motion without pain, normal range of motion and neck supple. No spinous process tenderness or muscular tenderness. Normal range  of motion.     Right lower leg: No edema.     Left lower leg: No edema.  Skin:    General: Skin is warm and dry.     Capillary Refill: Capillary refill takes less than 2 seconds.     Findings: No ecchymosis, erythema, rash or wound.  Neurological:     General: No focal deficit present.     Mental Status: She is alert and oriented to person, place, and time.     GCS: GCS eye subscore is 4. GCS verbal subscore is 5. GCS motor subscore is 6.     Cranial Nerves: Cranial nerves 2-12 are intact.     Sensory: Sensation is intact.     Motor: Motor function is intact.     Coordination: Coordination is intact.     Comments: Very weak, generally.  Cannot stand on her own.  Required 2 people assist to get her from wheelchair to bed.  Psychiatric:        Attention and Perception: Attention normal.        Mood and Affect:  Mood normal.        Speech: Speech normal.        Behavior: Behavior normal.     ED Results / Procedures / Treatments   Labs (all labs ordered are listed, but only abnormal results are displayed) Labs Reviewed  RESP PANEL BY RT-PCR (RSV, FLU A&B, COVID)  [id number] - Abnormal; Notable for the following components:      Result Value   SARS Coronavirus 2 by RT PCR POSITIVE (*)    All other components within normal limits  COMPREHENSIVE METABOLIC PANEL - Abnormal; Notable for the following components:   Sodium 133 (*)    Potassium 3.3 (*)    CO2 21 (*)    Glucose, Bld 186 (*)    Calcium 8.6 (*)    Total Protein 6.4 (*)    Albumin 3.0 (*)    AST 51 (*)    GFR, Estimated 52 (*)    All other components within normal limits  CBC - Abnormal; Notable for the following components:   Platelets 135 (*)    All other components within normal limits  URINALYSIS, ROUTINE W REFLEX MICROSCOPIC - Abnormal; Notable for the following components:   APPearance HAZY (*)    Ketones, ur 5 (*)    Protein, ur 100 (*)    Bacteria, UA RARE (*)    All other components within normal limits  LIPASE, BLOOD  LACTIC ACID, PLASMA  LACTIC ACID, PLASMA    EKG EKG Interpretation  Date/Time:  Friday July 30 2022 21:58:45 EST Ventricular Rate:  75 PR Interval:  132 QRS Duration: 80 QT Interval:  402 QTC Calculation: 448 R Axis:   67 Text Interpretation: Normal sinus rhythm Normal ECG When compared with ECG of 19-Sep-2004 12:00, PREVIOUS ECG IS PRESENT Confirmed by Gilda Crease [phone]) on 07/31/2022 4:38:31 AM  Radiology CT HEAD WO CONTRAST ( )  Result Date: 07/30/2022 CLINICAL DATA:  Altered mental status with prior falls. EXAM: CT HEAD WITHOUT CONTRAST TECHNIQUE: Contiguous axial images were obtained from the base of the skull through the vertex without intravenous contrast. RADIATION DOSE REDUCTION: This exam was performed according to the departmental dose-optimization program which includes  automated exposure control, adjustment of the mA and/or kV according to patient size and/or use of iterative reconstruction technique. COMPARISON:  None Available. FINDINGS: Brain: There is mild to moderate severity cerebral atrophy with widening of  the extra-axial spaces and ventricular dilatation. There are areas of decreased attenuation within the white matter tracts of the supratentorial brain, consistent with microvascular disease changes. Vascular: No hyperdense vessel or unexpected calcification. Skull: Normal. Negative for fracture or focal lesion. Sinuses/Orbits: No acute finding. Other: None. IMPRESSION: 1. No acute intracranial abnormality. 2. Cerebral atrophy and microvascular disease changes of the supratentorial brain. Electronically Signed   By: Virgina Norfolk M.D.   On: 07/30/2022 23:04   DG Chest 2 View  Result Date: 07/30/2022 CLINICAL DATA:  Fatigue EXAM: CHEST - 2 VIEW COMPARISON:  Chest x-ray 09/15/2004 FINDINGS: The heart is mildly enlarged. There is no focal lung consolidation, pleural effusion or pneumothorax. There are degenerative changes throughout the spine. IMPRESSION: 1. No active cardiopulmonary disease. 2. Mild cardiomegaly. Electronically Signed   By: Ronney Asters M.D.   On: 07/30/2022 22:51    Procedures Procedures    Medications Ordered in ED Medications - No data to display  ED Course/ Medical Decision Making/ A&P                             Medical Decision Making  Presents with generalized weakness and frequent falls.  This is a sudden change for her in the last few days.  She has had URI symptoms for a week or more.  She did test positive for COVID today which is likely the cause of her symptoms.  Patient normally can ambulate without assistance, cannot stand on her own at the bedside currently.  Will initiate some IV fluids.  Patient lives alone, does not have any family that can stay with her.  Will ask hospitalist to observe patient in  hospital.        Final Clinical Impression(s) / ED Diagnoses Final diagnoses:  COVID-19    Rx / DC Orders ED Discharge Orders     None         Lyvonne Cassell, Gwenyth Allegra, MD 07/31/22 0500

## 2022-07-31 NOTE — Consult Note (Addendum)
Cardiology Consultation   Patient ID: Kari Garcia MRN: [id number]; DOB: 1924/08/13  Admit date: 07/30/2022 Date of Consult: 07/31/2022  PCP:  Haywood Pao, MD   [place] Providers Cardiologist:  None        Patient Profile:   Kari Garcia is a 89 y.o. female with a hx of hypothyroidism who is being seen 07/31/2022 for the evaluation of elevated troponin at the request of Dr. Tamala Julian.  History of Present Illness:   Kari Garcia is a 89 year old female with a history of hypothyroidism who we are consulted for troponin elevation.  She is normally very active at baseline but for the last few days reports weakness and falls.  Reports recent URI symptoms.  Denies any chest pain.  Given the symptoms presented to the ED today.  Initial vital signs notable for BP 193/83, SpO2 fell to 89%, started on 2 L, pulse 77.  Labs notable for sodium 133, potassium 3.3, creatinine 1.0, lactate 1.3, hemoglobin 12.2, D-dimer 11.4, troponin 579, procalcitonin negative, magnesium 1.5, COVID-19 positive.  EKG shows normal sinus rhythm, rate 65, no ST abnormalities.   Past Medical History:  Diagnosis Date   Goiter    Hypothyroidism    Macular degeneration    Osteoporosis    Vitamin D deficiency     Past Surgical History:  Procedure Laterality Date   APPENDECTOMY     BUNIONECTOMY Right    COLONOSCOPY     benign polyp removed   teeth implant Right    TONSILLECTOMY       Inpatient Medications: Scheduled Meds:  albuterol  2 puff Inhalation Q6H   vitamin C  500 mg Oral Daily   dexamethasone  6 mg Oral Q24H   dorzolamide  1 drop Both Eyes BID   enoxaparin (LOVENOX) injection  30 mg Subcutaneous Q24H   famotidine  20 mg Oral BID   guaiFENesin  600 mg Oral BID   levothyroxine  25 mcg Oral Q0600   potassium chloride  40 mEq Oral STAT   sodium chloride flush  3 mL Intravenous Q12H   timolol  1 drop Both Eyes BID   zinc sulfate  220 mg Oral Daily   Continuous Infusions:   sodium chloride     magnesium sulfate bolus IVPB     [START ON 08/01/2022] remdesivir 100 mg in sodium chloride 0.9 % 100 mL IVPB     PRN Meds: acetaminophen, guaiFENesin-dextromethorphan, ondansetron **OR** ondansetron (ZOFRAN) IV  Allergies:    Allergies  Allergen Reactions   Codeine     REACTION: nausea and vomiting    Social History:   Social History   Socioeconomic History   Marital status: Widowed    Spouse name: Not on file   Number of children: Not on file   Years of education: Not on file   Highest education level: Not on file  Occupational History   Not on file  Tobacco Use   Smoking status: Unknown   Smokeless tobacco: Never  Vaping Use   Vaping Use: Never used  Substance and Sexual Activity   Alcohol use: Never   Drug use: Never   Sexual activity: Not on file  Other Topics Concern   Not on file  Social History Narrative   Not on file   Social Determinants of Health   Financial Resource Strain: Not on file  Food Insecurity: Not on file  Transportation Needs: Not on file  Physical Activity: Not on file  Stress: Not on file  Social Connections: Not on file  Intimate Partner Violence: Not on file    Family History:   History reviewed. No pertinent family history.   ROS:  Please see the history of present illness.   All other ROS reviewed and negative.     Physical Exam/Data:   Vitals:   07/31/22 0545 07/31/22 0600 07/31/22 0630 07/31/22 1004  BP:  (!) 144/54 (!) 139/56 (!) 140/77  Pulse: 68 66 66 70  Resp: (!) 21 (!) 24 (!) 21 20  Temp:    98.6 F (37 C)  TempSrc:    Oral  SpO2: 92% 91% 93% 93%  Weight:      Height:        Intake/Output Summary (Last 24 hours) at 07/31/2022 1241 Last data filed at 07/31/2022 1030 Gross per 24 hour  Intake 260 ml  Output --  Net 260 ml      07/31/2022    5:04 AM 07/30/2022    9:46 PM 10/07/2017    9:55 AM  Last 3 Weights  Weight (lbs) 99 lb 3.3 oz 101 lb 106 lb  Weight (kg) 45 kg 45.813 kg 48.081  kg     Body mass index is 20.04 kg/m.  General:  , in no acute distress HEENT: normal Neck: no JVD Cardiac:  normal S1, S2; RRR; no murmur  Lungs:  clear to auscultation bilaterally, no wheezing, rhonchi or rales  Abd: soft, nontender, no hepatomegaly  Ext: no edema Musculoskeletal:  No deformities, BUE and BLE strength normal and equal Skin: warm and dry  Neuro:  no focal abnormalities noted Psych:  Normal affect   EKG:  The EKG was personally reviewed and demonstrates:  normal sinus rhythm, rate 65, no ST abnormalities. Telemetry:  Telemetry was personally reviewed and demonstrates: Normal sinus rhythm  Relevant CV Studies:   Laboratory Data:  High Sensitivity Troponin:   Recent Labs  Lab 07/31/22 0831  TROPONINIHS 579*     Chemistry Recent Labs  Lab 07/30/22 2230 07/31/22 0831  NA 133*  --   K 3.3*  --   CL 102  --   CO2 21*  --   GLUCOSE 186*  --   BUN 23  --   CREATININE 1.00  --   CALCIUM 8.6*  --   MG  --  1.5*  GFRNONAA 52*  --   ANIONGAP 10  --     Recent Labs  Lab 07/30/22 2230  PROT 6.4*  ALBUMIN 3.0*  AST 51*  ALT 19  ALKPHOS 43  BILITOT 0.5   Lipids No results for input(s): "CHOL", "TRIG", "HDL", "LABVLDL", "LDLCALC", "CHOLHDL" in the last 168 hours.  Hematology Recent Labs  Lab 07/30/22 2230  WBC 4.8  RBC 3.93  HGB 12.2  HCT 36.4  MCV 92.6  MCH 31.0  MCHC 33.5  RDW 12.4  PLT 135*   Thyroid No results for input(s): "TSH", "FREET4" in the last 168 hours.  BNP Recent Labs  Lab 07/31/22 0831  BNP 509.1*    DDimer  Recent Labs  Lab 07/31/22 0831  DDIMER 11.39*     Radiology/Studies:  CT HEAD WO CONTRAST (5MM)  Result Date: 07/30/2022 CLINICAL DATA:  Altered mental status with prior falls. EXAM: CT HEAD WITHOUT CONTRAST TECHNIQUE: Contiguous axial images were obtained from the base of the skull through the vertex without intravenous contrast. RADIATION DOSE REDUCTION: This exam was performed according to the  departmental dose-optimization program which includes automated exposure control, adjustment of the  mA and/or kV according to patient size and/or use of iterative reconstruction technique. COMPARISON:  None Available. FINDINGS: Brain: There is mild to moderate severity cerebral atrophy with widening of the extra-axial spaces and ventricular dilatation. There are areas of decreased attenuation within the white matter tracts of the supratentorial brain, consistent with microvascular disease changes. Vascular: No hyperdense vessel or unexpected calcification. Skull: Normal. Negative for fracture or focal lesion. Sinuses/Orbits: No acute finding. Other: None. IMPRESSION: 1. No acute intracranial abnormality. 2. Cerebral atrophy and microvascular disease changes of the supratentorial brain. Electronically Signed   By: Aram Candela M.D.   On: 07/30/2022 23:04   DG Chest 2 View  Result Date: 07/30/2022 CLINICAL DATA:  Fatigue EXAM: CHEST - 2 VIEW COMPARISON:  Chest x-ray 09/15/2004 FINDINGS: The heart is mildly enlarged. There is no focal lung consolidation, pleural effusion or pneumothorax. There are degenerative changes throughout the spine. IMPRESSION: 1. No active cardiopulmonary disease. 2. Mild cardiomegaly. Electronically Signed   By: Darliss Cheney M.D.   On: 07/30/2022 22:51     Assessment and Plan:   Elevated troponin: Initial troponin 579.  In setting of COVID-19 infection and markedly elevated BP on presentation.  She denies any chest pain.  Suspect likely demand ischemia. -Trend troponins to peak -Check echocardiogram to evaluate for new wall motion abnormality -Agree with CTPA to rule out PE given significantly elevated D-dimer  COVID-19 infection: Treatment per primary team  Acute hypoxic respiratory failure: Likely due to COVID-19 infection as above.  CTPA ordered to rule out PE given significantly elevated D-dimer.  Elevated BP: No history of hypertension, but BP markedly elevated on  admission.  Has improved without treatment.  Continue to monitor  For questions or updates, please contact [facility] Please consult [email or web address] for contact info under    Signed, Little Ishikawa, MD  07/31/2022 12:41 PM

## 2022-07-31 NOTE — ED Notes (Signed)
Patient transported to CT 

## 2022-07-31 NOTE — ED Notes (Signed)
Got patient into a gown got patient up to the bedside toilet patient is back in the bed on  the monitor with family at bedside and call bell in reach

## 2022-07-31 NOTE — H&P (Addendum)
History and Physical    Patient: Kari Garcia JIR:[id number] DOB: [birth year] DOA: 07/30/2022 DOS: the patient was seen and examined on 07/31/2022 PCP: Tisovec, Fransico Him, MD  Patient coming from: Urgent care via EMS  Chief Complaint:  Chief Complaint  Patient presents with   Multiple Falls / Gen. Weakness   Weakness   Fall   HPI: Kari Garcia is a 89 y.o. female with medical history significant of hypothyroidism presenting with weakness after having multiple falls.  At baseline patient ambulates normally without assistance and carries out her ADLs without need of assistance.  In the last couple days she reports having several falls with complaints of weakness.  She reports falling once while in the house where she tripped over a cord.  Then fell while going to the end of her driveway despite trying to use a broom to steady herself.  She is able to get herself back up reported subsequently again. Approximately 2 weeks ago she started developing nasal congestion and sinus pressure with complaints of ear discomfort.  She reports having a productive cough, but he is unable to cough anything up.  Patient notes that over the last couple days she has started to feel unwell.  She complains of having subjective fever/chills, sleeping more than usual, nausea with decreased p.o. intake, and felt like head congestion had moved into her chest.  Denies having any focal weakness, change in vision, slurred speech, vomiting, diarrhea, chest pain, leg swelling, calf pain, lightheadedness, or dysuria symptoms.  She had initially went to urgent care due to the frequent falls and was sent to [facility] for further evaluation.  In the emergency department patient was noted to have temperature of 99 F with tachypnea, blood pressures anywhere from 139/56 to 193/83, and O2 saturations documented as low as 87% with improvement on 2 L of nasal cannula oxygen to greater than 92%.  Labs significant for platelets 135 sodium  133, potassium 3.3, BUN 23, creatinine 1, glucose 186, AST 51, and lactic acid 1.3.. COVID-19 screening was positive.  Urinalysis did not show any acute abnormality.  Chest x-ray noted mild cardiomegaly without acute disease.  CT imaging of the brain did not note any acute abnormality with cerebral atrophy and microvascular disease changes of the supratentorial brain..  Patient has been given Decadron 6 mg p.o. and started on remdesivir IV.  Review of Systems: As mentioned in the history of present illness. All other systems reviewed and are negative. Past Medical History:  Diagnosis Date   Goiter    Hypothyroidism    Macular degeneration    Osteoporosis    Vitamin D deficiency    Past Surgical History:  Procedure Laterality Date   APPENDECTOMY     BUNIONECTOMY Right    COLONOSCOPY     benign polyp removed   teeth implant Right    TONSILLECTOMY     Social History:  reports that she has an unknown smoking status. She has never used smokeless tobacco. She reports that she does not drink alcohol and does not use drugs.  Allergies  Allergen Reactions   Codeine     REACTION: nausea and vomiting    History reviewed. No pertinent family history.  Prior to Admission medications   Medication Sig Start Date End Date Taking? Authorizing Provider  acetaminophen (TYLENOL) 500 MG tablet Take 500 mg by mouth every 6 (six) hours as needed for moderate pain.   Yes [provider]  alendronate (FOSAMAX) 70 MG tablet Take 70 mg  by mouth once a week. Saturday   Yes [provider]  dorzolamide (TRUSOPT) 2 % ophthalmic solution Place 1 drop into both eyes 2 (two) times daily.   Yes [provider]  levothyroxine (SYNTHROID) 25 MCG tablet Take 25 mcg by mouth daily before breakfast.   Yes [provider]  timolol (BETIMOL) 0.5 % ophthalmic solution 1 drop 2 (two) times daily.   Yes [provider]  Vitamin D, Ergocalciferol, (DRISDOL) 50000 UNITS CAPS  capsule Take 50,000 Units by mouth every 7 (seven) days. Tuesday   Yes [provider]    Physical Exam: Vitals:   07/31/22 0530 07/31/22 0545 07/31/22 0600 07/31/22 0630  BP: (!) 142/60  (!) 144/54 (!) 139/56  Pulse: 68 68 66 66  Resp: 19 (!) 21 (!) 24 (!) 21  Temp:      TempSrc:      SpO2: 92% 92% 91% 93%  Weight:      Height:        Constitutional: Small elderly female who is initially resting, but arousable Eyes: PERRL, lids and conjunctivae normal ENMT: Mucous membranes are dry.  Fair dentition. Neck: normal, supple.  No significant JVD. Respiratory: clear to auscultation bilaterally, no wheezing, no crackles.  O2 sat ration is currently maintained on 2 L of nasal cannula oxygen. Cardiovascular: Regular rate and rhythm, no murmurs / rubs / gallops. No extremity edema.   Abdomen: no tenderness, no masses palpated.  Bowel sounds positive.  Musculoskeletal: no clubbing / cyanosis. No joint deformity upper and lower extremities. Good ROM, no contractures.   Skin: no rashes, lesions, ulcers.  Bruise of the left shoulder Neurologic: CN 2-12 grossly intact. Sensation intact, DTR normal. Strength 5/5 in all 4.  Psychiatric: Normal judgment and insight. Alert and oriented x 3. Normal mood.   Data Reviewed:  EKG noted normal sinus rhythm at 75 bpm with QTc 448.  Assessment and Plan: Acute respiratory failure with hypoxia  COVID-19 infection Patient presents with complaints of weakness with reports of cough and congestion over the last 1 to 2 weeks.  O2 saturation 90 be as low as 87% with improvement on 2 L nasal cannula oxygen.  Patient is not on oxygen at baseline.  Chest x-ray noted mildly enlarged heart without acute abnormality without frank edema.  COVID-19 screening was positive. -Admit to a telemetry bed -COVID-19 order set utilized with Airborne precautions -Continuous pulse oximetry with oxygen to maintain saturation greater than 92% -Check inflammatory markers  including D-dimer(11.39), CRP (0.8), lactic acid (0.8), LDH (308), and procalcitonin (<0.1) -Incentive spirometry and flutter valve -Check CT angiogram of the chest due to elevated BP dimer -Continue remdesivir -Decadron 6 mg p.o. daily -Albuterol inhaler -Vitamin C and zinc -Antitussives as needed -Tylenol as needed for fever  Weakness with frequent falls At baseline patient ambulates without need of assistance, but due to weakness and malaise have reported having at least 3 falls recently.  Patient was noted to have bruising of the left shoulder and hit her head.  CT scan of the brain did not note any acute abnormality. -Up with assistance -PT to eval and treat possibly tomorrow -May warrant further workup depending on current pending studies  Elevated troponin Initial high-sensitivity troponin elevated at 579.  Patient denies any complaints of chest pain, but.  Chest x-ray had noted enlarged cardiac silhouette.  Initial EKG did not note any significant ischemic changes. -Check echocardiogram -Would start patient on heparin drip if troponin continues to rise or noted  have concern for pulmonary embolism -Cardiology consulted,  will follow-up for any further recommendations  Hypokalemia Hypomagnesemia Acute. Initial potassium 3.3 and magnesium 1.5.. -Give 40 mEq of potassium chloride -Give 2 g of magnesium sulfate IV -Continue to monitor and replace as needed  Hypothyroidism -Check TSH -Continue levothyroxine  DVT prophylaxis: Lovenox Advance Care Planning:   Code Status: Full Code    Consults: None  Family Communication: Niece updated over the phone and we will notify other family members.  Severity of Illness: The appropriate patient status for this patient is OBSERVATION. Observation status is judged to be reasonable and necessary in order to provide the required intensity of service to ensure the patient's safety. The patient's presenting symptoms, physical exam findings,  and initial radiographic and laboratory data in the context of their medical condition is felt to place them at decreased risk for further clinical deterioration. Furthermore, it is anticipated that the patient will be medically stable for discharge from the hospital within 2 midnights of admission.   Author: Clydie Braun, MD 07/31/2022 8:08 AM  For on call review [email or web address].

## 2022-07-31 NOTE — ED Notes (Signed)
ED TO INPATIENT HANDOFF REPORT  ED Nurse Name and Phone #:  Lorin Picket [phone]  S Name/Age/Gender Kari Garcia 89 y.o. female Room/Bed: 044C/044C  Code Status   Code Status: Full Code  Home/SNF/Other Home Patient oriented to: self, place, time, and situation Is this baseline? Yes   Triage Complete: Triage complete  Chief Complaint COVID-19 virus infection [U07.1]  Triage Note Patient arrived with EMS from a local urgent care reports multiple falls for the past 2 days , denies injury / no anticoagulant medication, family also noticed tremors today . CBG= 134.   Pt's family member states the prior to the falls pt has been lying in the bed and not eating well for the past few days. Pt reports some cough at night time, nausea, and diarrhea.    Allergies Allergies  Allergen Reactions   Codeine     REACTION: nausea and vomiting    Level of Care/Admitting Diagnosis ED Disposition     ED Disposition  Admit   Condition  --   Comment  Hospital Area: [facility] [[id number]]  Level of Care: Telemetry Medical [104]  May place patient in observation at [facility] or [facility] if equivalent level of care is available:: Yes  Covid Evaluation: Asymptomatic - no recent exposure (last 10 days) testing not required  Diagnosis: COVID-19 virus infection [[id number]]  Admitting Physician: Magnus Ivan [[id number]]  Attending Physician: Magnus Ivan [[id number]]          B Medical/Surgery History Past Medical History:  Diagnosis Date   Goiter    Hypothyroidism    Macular degeneration    Osteoporosis    Vitamin D deficiency    Past Surgical History:  Procedure Laterality Date   APPENDECTOMY     BUNIONECTOMY Right    COLONOSCOPY     benign polyp removed   teeth implant Right    TONSILLECTOMY       A IV Location/Drains/Wounds Patient Lines/Drains/Airways Status     Active Line/Drains/Airways     Name Placement date Placement time Site Days    Peripheral IV 07/30/22 22 G Left Antecubital 07/30/22  --  Antecubital  1   Peripheral IV 07/31/22 20 G 1" Anterior;Right Forearm 07/31/22  1253  Forearm  less than 1            Intake/Output Last 24 hours  Intake/Output Summary (Last 24 hours) at 07/31/2022 1718 Last data filed at 07/31/2022 1525 Gross per 24 hour  Intake 310 ml  Output --  Net 310 ml    Labs/Imaging Results for orders placed or performed during the hospital encounter of 07/30/22 (from the past 48 hour(s))  Resp panel by RT-PCR (RSV, Flu A&B, Covid) Anterior Nasal Swab     Status: Abnormal   Collection Time: 07/30/22 10:22 PM   Specimen: Anterior Nasal Swab  Result Value Ref Range   SARS Coronavirus 2 by RT PCR POSITIVE (A) NEGATIVE    Comment: (NOTE) SARS-CoV-2 target nucleic acids are DETECTED.  The SARS-CoV-2 RNA is generally detectable in upper respiratory specimens during the acute phase of infection. Positive results are indicative of the presence of the identified virus, but do not rule out bacterial infection or co-infection with other pathogens not detected by the test. Clinical correlation with patient history and other diagnostic information is necessary to determine patient infection status. The expected result is Negative.  Fact Sheet for Patients: BloggerCourse.com  Fact Sheet for Healthcare Providers: SeriousBroker.it  This test is not yet approved or  cleared by the [place] and  has been authorized for detection and/or diagnosis of SARS-CoV-2 by FDA under an Emergency Use Authorization (EUA).  This EUA will remain in effect (meaning this test can be used) for the duration of  the COVID-19 declaration under Section 564(b)(1) of the A ct, 21 U.S.C. section 360bbb-3(b)(1), unless the authorization is terminated or revoked sooner.     Influenza A by PCR NEGATIVE NEGATIVE   Influenza B by PCR NEGATIVE NEGATIVE    Comment:  (NOTE) The Xpert Xpress SARS-CoV-2/FLU/RSV plus assay is intended as an aid in the diagnosis of influenza from Nasopharyngeal swab specimens and should not be used as a sole basis for treatment. Nasal washings and aspirates are unacceptable for Xpert Xpress SARS-CoV-2/FLU/RSV testing.  Fact Sheet for Patients: EntrepreneurPulse.com.au  Fact Sheet for Healthcare Providers: IncredibleEmployment.be  This test is not yet approved or cleared by the [place] FDA and has been authorized for detection and/or diagnosis of SARS-CoV-2 by FDA under an Emergency Use Authorization (EUA). This EUA will remain in effect (meaning this test can be used) for the duration of the COVID-19 declaration under Section 564(b)(1) of the Act, 21 U.S.C. section 360bbb-3(b)(1), unless the authorization is terminated or revoked.     Resp Syncytial Virus by PCR NEGATIVE NEGATIVE    Comment: (NOTE) Fact Sheet for Patients: EntrepreneurPulse.com.au  Fact Sheet for Healthcare Providers: IncredibleEmployment.be  This test is not yet approved or cleared by the [place] FDA and has been authorized for detection and/or diagnosis of SARS-CoV-2 by FDA under an Emergency Use Authorization (EUA). This EUA will remain in effect (meaning this test can be used) for the duration of the COVID-19 declaration under Section 564(b)(1) of the Act, 21 U.S.C. section 360bbb-3(b)(1), unless the authorization is terminated or revoked.  Performed at [facility], [place] [address], [place], [place] [address]   Lipase, blood     Status: None   Collection Time: 07/30/22 10:30 PM  Result Value Ref Range   Lipase 46 11 - 51 U/L    Comment: Performed at [place] [address], [place], [place] [address]  Comprehensive metabolic panel     Status: Abnormal   Collection Time: 07/30/22 10:30 PM  Result Value Ref Range   Sodium 133 (L) 135 -  145 mmol/L   Potassium 3.3 (L) 3.5 - 5.1 mmol/L   Chloride 102 98 - 111 mmol/L   CO2 21 (L) 22 - 32 mmol/L   Glucose, Bld 186 (H) 70 - 99 mg/dL    Comment: Glucose reference range applies only to samples taken after fasting for at least 8 hours.   BUN 23 8 - 23 mg/dL   Creatinine, Ser 1.00 0.44 - 1.00 mg/dL   Calcium 8.6 (L) 8.9 - 10.3 mg/dL   Total Protein 6.4 (L) 6.5 - 8.1 g/dL   Albumin 3.0 (L) 3.5 - 5.0 g/dL   AST 51 (H) 15 - 41 U/L   ALT 19 0 - 44 U/L   Alkaline Phosphatase 43 38 - 126 U/L   Total Bilirubin 0.5 0.3 - 1.2 mg/dL   GFR, Estimated 52 (L) >60 mL/min    Comment: (NOTE) Calculated using the CKD-EPI Creatinine Equation (2021)    Anion gap 10 5 - 15    Comment: Performed at [facility], [place] [address] [place], [place] [address]  CBC     Status: Abnormal   Collection Time: 07/30/22 10:30 PM  Result Value  Ref Range   WBC 4.8 4.0 - 10.5 K/uL   RBC 3.93 3.87 - 5.11 MIL/uL   Hemoglobin 12.2 12.0 - 15.0 g/dL   HCT 67.6 19.5 - 09.3 %   MCV 92.6 80.0 - 100.0 fL   MCH 31.0 26.0 - 34.0 pg   MCHC 33.5 30.0 - 36.0 g/dL   RDW 26.7 12.4 - 58.0 %   Platelets 135 (L) 150 - 400 K/uL    Comment: REPEATED TO VERIFY   nRBC 0.0 0.0 - 0.2 %    Comment: Performed at [facility], [address] [place], [place] [address]  Lactic acid, plasma     Status: None   Collection Time: 07/30/22 10:30 PM  Result Value Ref Range   Lactic Acid, Venous 1.3 0.5 - 1.9 mmol/L    Comment: Performed at [facility], [address] [place], [place] [address]  Urinalysis, Routine w reflex microscopic Urine, Clean Catch     Status: Abnormal   Collection Time: 07/30/22 10:32 PM  Result Value Ref Range   Color, Urine YELLOW YELLOW   APPearance HAZY (A) CLEAR   Specific Gravity, Urine 1.019 1.005 - 1.030   pH 5.0 5.0 - 8.0   Glucose, UA NEGATIVE NEGATIVE mg/dL   Hgb urine dipstick NEGATIVE NEGATIVE   Bilirubin Urine NEGATIVE NEGATIVE   Ketones, ur 5 (A) NEGATIVE mg/dL    Protein, ur 397 (A) NEGATIVE mg/dL   Nitrite NEGATIVE NEGATIVE   Leukocytes,Ua NEGATIVE NEGATIVE   RBC / HPF 6-10 0 - 5 RBC/hpf   WBC, UA 0-5 0 - 5 WBC/hpf   Bacteria, UA RARE (A) NONE SEEN   Squamous Epithelial / HPF 0-5 0 - 5 /HPF   Mucus PRESENT    Hyaline Casts, UA PRESENT     Comment: Performed at [facility], [address], [place], [place] [address]  Lactic acid, plasma     Status: None   Collection Time: 07/31/22  8:31 AM  Result Value Ref Range   Lactic Acid, Venous 0.8 0.5 - 1.9 mmol/L    Comment: Performed at [facility], [address], [place], [place] [address]  Brain natriuretic peptide     Status: Abnormal   Collection Time: 07/31/22  8:31 AM  Result Value Ref Range   B Natriuretic Peptide 509.1 (H) 0.0 - 100.0 pg/mL    Comment: Performed at [facility], [address], [place], [place] [address]  C-reactive protein     Status: None   Collection Time: 07/31/22  8:31 AM  Result Value Ref Range   CRP 0.8 <1.0 mg/dL    Comment: Performed at [facility], [address], [place], [place] [address]  D-dimer, quantitative     Status: Abnormal   Collection Time: 07/31/22  8:31 AM  Result Value Ref Range   D-Dimer, Quant 11.39 (H) 0.00 - 0.50 ug/mL-FEU    Comment: (NOTE) At the manufacturer cut-off value of 0.5 g/mL FEU, this assay has a negative predictive value of 95-100%.This assay is intended for use in conjunction with a clinical pretest probability (PTP) assessment model to exclude pulmonary embolism (PE) and deep venous thrombosis (DVT) in outpatients suspected of PE or DVT. Results should be correlated with clinical presentation. Performed at [facility], [address], [place], [place] [address]   Ferritin     Status: None   Collection Time: 07/31/22  8:31 AM  Result Value Ref Range  Ferritin 94 11 - 307 ng/mL    Comment: Performed at [facility], [address] [place], [place] [address]  Hepatitis B surface  antigen     Status: None   Collection Time: 07/31/22  8:31 AM  Result Value Ref Range   Hepatitis B Surface Ag NON REACTIVE NON REACTIVE    Comment: Performed at [facility], [address] [place], [place] [address]  Lactate dehydrogenase     Status: Abnormal   Collection Time: 07/31/22  8:31 AM  Result Value Ref Range   LDH 308 (H) 98 - 192 U/L    Comment: Performed at [facility], [address] [place], [place] [address]  Procalcitonin     Status: None   Collection Time: 07/31/22  8:31 AM  Result Value Ref Range   Procalcitonin <0.10 ng/mL    Comment:        Interpretation: PCT (Procalcitonin) <= 0.5 ng/mL: Systemic infection (sepsis) is not likely. Local bacterial infection is possible. (NOTE)       Sepsis PCT Algorithm           Lower Respiratory Tract                                      Infection PCT Algorithm    ----------------------------     ----------------------------         PCT < 0.25 ng/mL                PCT < 0.10 ng/mL          Strongly encourage             Strongly discourage   discontinuation of antibiotics    initiation of antibiotics    ----------------------------     -----------------------------       PCT 0.25 - 0.50 ng/mL            PCT 0.10 - 0.25 ng/mL               OR       >80% decrease in PCT            Discourage initiation of                                            antibiotics      Encourage discontinuation           of antibiotics    ----------------------------     -----------------------------         PCT >= 0.50 ng/mL              PCT 0.26 - 0.50 ng/mL               AND        <80% decrease in PCT             Encourage initiation of                                             antibiotics       Encourage continuation           of antibiotics    ----------------------------     -----------------------------  PCT >= 0.50 ng/mL                  PCT > 0.50 ng/mL               AND         increase in PCT                   Strongly encourage                                      initiation of antibiotics    Strongly encourage escalation           of antibiotics                                     -----------------------------                                           PCT <= 0.25 ng/mL                                                 OR                                        > 80% decrease in PCT                                      Discontinue / Do not initiate                                             antibiotics  Performed at [facility], [address], [place], [place] [address]   Troponin I (High Sensitivity)     Status: Abnormal   Collection Time: 07/31/22  8:31 AM  Result Value Ref Range   Troponin I (High Sensitivity) 579 (HH) <18 ng/L    Comment: CRITICAL RESULT CALLED TO, READ BACK BY AND VERIFIED WITH S,Julie-Anne Torain RN @[phone]  07/31/22 E,[place] (NOTE) Elevated high sensitivity troponin I (hsTnI) values and significant  changes across serial measurements may suggest ACS but many other  chronic and acute conditions are known to elevate hsTnI results.  Refer to the "Links" section for chest pain algorithms and additional  guidance. Performed at [facility], [place] [address] [place], [place] [address]   Magnesium     Status: Abnormal   Collection Time: 07/31/22  8:31 AM  Result Value Ref Range   Magnesium 1.5 (L) 1.7 - 2.4 mg/dL    Comment: Performed at [facility] [place] [address], [place], [place] [address]  Phosphorus     Status: None   Collection Time: 07/31/22  8:31 AM  Result Value Ref Range   Phosphorus 3.0 2.5 - 4.6 mg/dL    Comment:  Performed at [facility], [address] [place], [place] [address]  TSH     Status: None   Collection Time: 07/31/22 12:25 PM  Result Value Ref Range   TSH 1.006 0.350 - 4.500 uIU/mL    Comment: Performed by a 3rd Generation assay with a functional sensitivity of <=0.01 uIU/mL. Performed at [facility], [address] [place], [place] [address]   Troponin I (High Sensitivity)     Status: Abnormal   Collection Time: 07/31/22 12:53 PM  Result Value Ref Range   Troponin I (High Sensitivity) 368 (HH) <18 ng/L    Comment: CRITICAL VALUE NOTED. VALUE IS CONSISTENT WITH PREVIOUSLY REPORTED/CALLED VALUE (NOTE) Elevated high sensitivity troponin I (hsTnI) values and significant  changes across serial measurements may suggest ACS but many other  chronic and acute conditions are known to elevate hsTnI results.  Refer to the "Links" section for chest pain algorithms and additional  guidance. Performed at [facility], [address] [place], [place] [address]    CT Angio Chest Pulmonary Embolism (PE) W or WO Contrast  Result Date: 07/31/2022 CLINICAL DATA:  Generalized weakness with some cough and nasal congestion 1 week. Possible pulmonary embolism. EXAM: CT ANGIOGRAPHY CHEST WITH CONTRAST TECHNIQUE: Multidetector CT imaging of the chest was performed using the standard protocol during bolus administration of intravenous contrast. Multiplanar CT image reconstructions and MIPs were obtained to evaluate the vascular anatomy. RADIATION DOSE REDUCTION: This exam was performed according to the departmental dose-optimization program which includes automated exposure control, adjustment of the mA and/or kV according to patient size and/or use of iterative reconstruction technique. CONTRAST:  75mL OMNIPAQUE IOHEXOL 350 MG/ML SOLN COMPARISON:  Chest x-ray 07/30/2022 FINDINGS: Cardiovascular: Mild cardiomegaly. Calcified plaque over the left main and 3 vessel coronary arteries. Thoracic aorta is normal caliber. There is calcified plaque over the thoracic aorta. Pulmonary arterial system is well opacified without evidence of emboli. Remaining vascular structures are unremarkable. Mediastinum/Nodes: No mediastinal or hilar adenopathy. Remaining mediastinal structures are unremarkable. Lungs/Pleura: Lungs are normally  inflated with very minimal patchy linear density within the lung bases and lingula compatible with atelectasis/scarring. Subtle dependent atelectasis over the posterior right upper lobe. No acute airspace process. No effusion. Airways are normal. Upper Abdomen: Calcified plaque over the abdominal aorta. No acute findings. Musculoskeletal: Moderate compression fracture in the region of the thoracolumbar junction with mild compression deformity of the vertebral body just above this level. Age of these compression deformities is indeterminate. Review of the MIP images confirms the above findings. IMPRESSION: 1. No acute cardiopulmonary disease and no evidence of pulmonary emboli. Mild patchy atelectasis versus scarring. 2. Mild cardiomegaly. Atherosclerotic coronary artery disease. 3. Moderate compression fracture in the region of the thoracolumbar junction with mild compression deformity of the vertebral body just above this level. Age of these compression deformities is indeterminate. 4. Aortic atherosclerosis. Aortic Atherosclerosis (ICD10-I70.0). Electronically Signed   By: Elberta Fortisaniel  Boyle M.D.   On: 07/31/2022 16:27   CT HEAD WO CONTRAST (5MM)  Result Date: 07/30/2022 CLINICAL DATA:  Altered mental status with prior falls. EXAM: CT HEAD WITHOUT CONTRAST TECHNIQUE: Contiguous axial images were obtained from the base of the skull through the vertex without intravenous contrast. RADIATION DOSE REDUCTION: This exam was performed according to the departmental dose-optimization program which includes automated exposure control, adjustment of the mA and/or kV according to patient size and/or use of iterative reconstruction technique. COMPARISON:  None Available. FINDINGS: Brain: There is mild  to moderate severity cerebral atrophy with widening of the extra-axial spaces and ventricular dilatation. There are areas of decreased attenuation within the white matter tracts of the supratentorial brain, consistent with  microvascular disease changes. Vascular: No hyperdense vessel or unexpected calcification. Skull: Normal. Negative for fracture or focal lesion. Sinuses/Orbits: No acute finding. Other: None. IMPRESSION: 1. No acute intracranial abnormality. 2. Cerebral atrophy and microvascular disease changes of the supratentorial brain. Electronically Signed   By: Aram Candela M.D.   On: 07/30/2022 23:04   DG Chest 2 View  Result Date: 07/30/2022 CLINICAL DATA:  Fatigue EXAM: CHEST - 2 VIEW COMPARISON:  Chest x-ray 09/15/2004 FINDINGS: The heart is mildly enlarged. There is no focal lung consolidation, pleural effusion or pneumothorax. There are degenerative changes throughout the spine. IMPRESSION: 1. No active cardiopulmonary disease. 2. Mild cardiomegaly. Electronically Signed   By: Darliss Cheney M.D.   On: 07/30/2022 22:51    Pending Labs Unresulted Labs (From admission, onward)     Start     Ordered   08/01/22 0500  D-dimer, quantitative  Daily,   R      07/31/22 0817   08/01/22 0500  C-reactive protein  Daily,   R      07/31/22 0817   08/01/22 0500  CBC with Differential/Platelet  Daily,   R      07/31/22 0817   08/01/22 0500  Comprehensive metabolic panel  Daily,   R      07/31/22 0817   07/31/22 0818  Magnesium  Daily,   R      07/31/22 0817   07/31/22 0818  Phosphorus  Daily,   R      07/31/22 0817            Vitals/Pain Today's Vitals   07/31/22 0630 07/31/22 1004 07/31/22 1500 07/31/22 1634  BP: (!) 139/56 (!) 140/77 (!) 115/52   Pulse: 66 70 (!) 58   Resp: (!) 21 20 16    Temp:  98.6 F (37 C)  98 F (36.7 C)  TempSrc:  Oral  Oral  SpO2: 93% 93% 94%   Weight:      Height:      PainSc:        Isolation Precautions Airborne and Contact precautions  Medications Medications  remdesivir 200 mg in sodium chloride 0.9% 250 mL IVPB (0 mg Intravenous Stopped 07/31/22 0736)    Followed by  remdesivir 100 mg in sodium chloride 0.9 % 100 mL IVPB (has no administration in time  range)  dexamethasone (DECADRON) tablet 6 mg (6 mg Oral Given 07/31/22 0623)  enoxaparin (LOVENOX) injection 30 mg (30 mg Subcutaneous Given 07/31/22 1030)  levothyroxine (SYNTHROID) tablet 25 mcg (25 mcg Oral Given 07/31/22 1028)  sodium chloride flush (NS) 0.9 % injection 3 mL (3 mLs Intravenous Given 07/31/22 1030)  albuterol (VENTOLIN HFA) 108 (90 Base) MCG/ACT inhaler 2 puff (2 puffs Inhalation Given 07/31/22 1420)  guaiFENesin-dextromethorphan (ROBITUSSIN DM) 100-10 MG/5ML syrup 10 mL (has no administration in time range)  ascorbic acid (VITAMIN C) tablet 500 mg (500 mg Oral Given 07/31/22 1028)  zinc sulfate capsule 220 mg (220 mg Oral Given 07/31/22 1028)  ondansetron (ZOFRAN) tablet 4 mg (has no administration in time range)    Or  ondansetron (ZOFRAN) injection 4 mg (has no administration in time range)  famotidine (PEPCID) tablet 20 mg (20 mg Oral Given 07/31/22 1028)  acetaminophen (TYLENOL) tablet 650 mg (has no administration in time range)  guaiFENesin (MUCINEX) 12 hr  tablet 600 mg (600 mg Oral Given 07/31/22 1326)  0.9 %  sodium chloride infusion ( Intravenous New Bag/Given 07/31/22 1338)  dorzolamide (TRUSOPT) 2 % ophthalmic solution 1 drop (has no administration in time range)  timolol (TIMOPTIC) 0.5 % ophthalmic solution 1 drop (has no administration in time range)  potassium chloride SA (KLOR-CON M) CR tablet 40 mEq (40 mEq Oral Given 07/31/22 1326)  magnesium sulfate IVPB 2 g 50 mL (0 g Intravenous Stopped 07/31/22 1525)  iohexol (OMNIPAQUE) 350 MG/ML injection 75 mL (75 mLs Intravenous Contrast Given 07/31/22 1613)    Mobility walks     Focused Assessments Pulmonary Assessment Handoff:  Lung sounds:   O2 Device: Nasal Cannula O2 Flow Rate (L/min): 2 L/min    R Recommendations: See Admitting Provider Note  Report given to:   Additional Notes: Pt had 3 falls yesterday, general weakness, Is Covid +, walks without walker at baseline, 2L [place], Lungs sound course, random non  productive cough, no N/V/D, been sleepy most of the day, takes pills whole without trouble, brief/purewick in place, calls appropriately, family updated

## 2022-08-01 ENCOUNTER — Observation Stay (HOSPITAL_BASED_OUTPATIENT_CLINIC_OR_DEPARTMENT_OTHER): Payer: Medicare Other

## 2022-08-01 DIAGNOSIS — T380X5A Adverse effect of glucocorticoids and synthetic analogues, initial encounter: Secondary | ICD-10-CM | POA: Diagnosis not present

## 2022-08-01 DIAGNOSIS — J9601 Acute respiratory failure with hypoxia: Secondary | ICD-10-CM

## 2022-08-01 DIAGNOSIS — Z7983 Long term (current) use of bisphosphonates: Secondary | ICD-10-CM | POA: Diagnosis not present

## 2022-08-01 DIAGNOSIS — M810 Age-related osteoporosis without current pathological fracture: Secondary | ICD-10-CM | POA: Diagnosis present

## 2022-08-01 DIAGNOSIS — R7989 Other specified abnormal findings of blood chemistry: Secondary | ICD-10-CM

## 2022-08-01 DIAGNOSIS — E039 Hypothyroidism, unspecified: Secondary | ICD-10-CM | POA: Diagnosis present

## 2022-08-01 DIAGNOSIS — I2489 Other forms of acute ischemic heart disease: Secondary | ICD-10-CM | POA: Diagnosis present

## 2022-08-01 DIAGNOSIS — W1809XA Striking against other object with subsequent fall, initial encounter: Secondary | ICD-10-CM | POA: Diagnosis present

## 2022-08-01 DIAGNOSIS — Y92239 Unspecified place in hospital as the place of occurrence of the external cause: Secondary | ICD-10-CM | POA: Diagnosis not present

## 2022-08-01 DIAGNOSIS — S40012A Contusion of left shoulder, initial encounter: Secondary | ICD-10-CM | POA: Diagnosis present

## 2022-08-01 DIAGNOSIS — R296 Repeated falls: Secondary | ICD-10-CM | POA: Diagnosis present

## 2022-08-01 DIAGNOSIS — Y92009 Unspecified place in unspecified non-institutional (private) residence as the place of occurrence of the external cause: Secondary | ICD-10-CM | POA: Diagnosis not present

## 2022-08-01 DIAGNOSIS — U071 COVID-19: Secondary | ICD-10-CM | POA: Diagnosis present

## 2022-08-01 DIAGNOSIS — Z79890 Hormone replacement therapy (postmenopausal): Secondary | ICD-10-CM | POA: Diagnosis not present

## 2022-08-01 DIAGNOSIS — Z885 Allergy status to narcotic agent status: Secondary | ICD-10-CM | POA: Diagnosis not present

## 2022-08-01 DIAGNOSIS — R030 Elevated blood-pressure reading, without diagnosis of hypertension: Secondary | ICD-10-CM | POA: Diagnosis present

## 2022-08-01 DIAGNOSIS — E876 Hypokalemia: Secondary | ICD-10-CM | POA: Diagnosis present

## 2022-08-01 LAB — COMPREHENSIVE METABOLIC PANEL
ALT: 22 U/L (ref 0–44)
AST: 47 U/L — ABNORMAL HIGH (ref 15–41)
Albumin: 2.5 g/dL — ABNORMAL LOW (ref 3.5–5.0)
Alkaline Phosphatase: 36 U/L — ABNORMAL LOW (ref 38–126)
Anion gap: 8 (ref 5–15)
BUN: 18 mg/dL (ref 8–23)
CO2: 19 mmol/L — ABNORMAL LOW (ref 22–32)
Calcium: 8 mg/dL — ABNORMAL LOW (ref 8.9–10.3)
Chloride: 111 mmol/L (ref 98–111)
Creatinine, Ser: 0.83 mg/dL (ref 0.44–1.00)
GFR, Estimated: >60 mL/min (ref >60)
Glucose, Bld: 124 mg/dL — ABNORMAL HIGH (ref 70–99)
Potassium: 3.5 mmol/L (ref 3.5–5.1)
Sodium: 138 mmol/L (ref 135–145)
Total Bilirubin: 0.3 mg/dL (ref 0.3–1.2)
Total Protein: 5.8 g/dL — ABNORMAL LOW (ref 6.5–8.1)

## 2022-08-01 LAB — ECHOCARDIOGRAM COMPLETE
AR max vel: 2.17 cm2
AV Area VTI: 2.04 cm2
AV Area mean vel: 2.11 cm2
AV Mean grad: 3 mmHg
AV Peak grad: 5.7 mmHg
Ao pk vel: 1.19 m/s
Area-P 1/2: 3.17 cm2
Height: 59 in
S' Lateral: 2.6 cm
Weight: 1587.31 [oz_av]

## 2022-08-01 LAB — CBC WITH DIFFERENTIAL/PLATELET
Abs Immature Granulocytes: 0.02 K/uL (ref 0.00–0.07)
Basophils Absolute: 0 K/uL (ref 0.0–0.1)
Basophils Relative: 0 %
Eosinophils Absolute: 0 K/uL (ref 0.0–0.5)
Eosinophils Relative: 0 %
HCT: 35.6 % — ABNORMAL LOW (ref 36.0–46.0)
Hemoglobin: 11.7 g/dL — ABNORMAL LOW (ref 12.0–15.0)
Immature Granulocytes: 0 %
Lymphocytes Relative: 24 %
Lymphs Abs: 1.3 K/uL (ref 0.7–4.0)
MCH: 30.7 pg (ref 26.0–34.0)
MCHC: 32.9 g/dL (ref 30.0–36.0)
MCV: 93.4 fL (ref 80.0–100.0)
Monocytes Absolute: 0.7 K/uL (ref 0.1–1.0)
Monocytes Relative: 12 %
Neutro Abs: 3.6 K/uL (ref 1.7–7.7)
Neutrophils Relative %: 64 %
Platelets: 127 K/uL — ABNORMAL LOW (ref 150–400)
RBC: 3.81 MIL/uL — ABNORMAL LOW (ref 3.87–5.11)
RDW: 12.6 % (ref 11.5–15.5)
WBC: 5.5 K/uL (ref 4.0–10.5)
nRBC: 0 % (ref 0.0–0.2)

## 2022-08-01 LAB — C-REACTIVE PROTEIN: CRP: 0.6 mg/dL (ref <1.0)

## 2022-08-01 LAB — D-DIMER, QUANTITATIVE: D-Dimer, Quant: 6.22 ug/mL-FEU — ABNORMAL HIGH (ref 0.00–0.50)

## 2022-08-01 LAB — PHOSPHORUS: Phosphorus: 3.1 mg/dL (ref 2.5–4.6)

## 2022-08-01 LAB — MAGNESIUM: Magnesium: 2 mg/dL (ref 1.7–2.4)

## 2022-08-01 MED ORDER — FAMOTIDINE 20 MG PO TABS
10.0000 mg | ORAL_TABLET | Freq: Two times a day (BID) | ORAL | Status: DC
  Administered 2022-08-01 – 2022-08-02 (×2): 10 mg via ORAL
  Filled 2022-08-01 (×2): qty 1

## 2022-08-01 MED ORDER — ALBUTEROL SULFATE HFA 108 (90 BASE) MCG/ACT IN AERS: 2.0000 | INHALATION_SPRAY | Freq: Four times a day (QID) | RESPIRATORY_TRACT | Status: DC | PRN

## 2022-08-01 NOTE — Progress Notes (Signed)
Echocardiogram 2D Echocardiogram has been performed.  Kari Garcia 08/01/2022, 11:58 AM

## 2022-08-01 NOTE — Progress Notes (Addendum)
PROGRESS NOTE    Kari Garcia  OPF:[id number] DOB: 03/24/1925 DOA: 07/30/2022 PCP: Gaspar Garbe, MD     Brief Narrative:   From admission h and p  Kari Garcia is a 89 y.o. female with medical history significant of hypothyroidism presenting with weakness after having multiple falls.  At baseline patient ambulates normally without assistance and carries out her ADLs without need of assistance.  In the last couple days she reports having several falls with complaints of weakness.  She reports falling once while in the house where she tripped over a cord.  Then fell while going to the end of her driveway despite trying to use a broom to steady herself.  She is able to get herself back up reported subsequently again. Approximately 2 weeks ago she started developing nasal congestion and sinus pressure with complaints of ear discomfort.  She reports having a productive cough, but he is unable to cough anything up.  Patient notes that over the last couple days she has started to feel unwell.  She complains of having subjective fever/chills, sleeping more than usual, nausea with decreased p.o. intake, and felt like head congestion had moved into her chest.  Denies having any focal weakness, change in vision, slurred speech, vomiting, diarrhea, chest pain, leg swelling, calf pain, lightheadedness, or dysuria symptoms.  She had initially went to urgent care due to the frequent falls and was sent to [facility] for further evaluation.    Assessment & Plan:   Principal Problem:   COVID-19 virus infection Active Problems:   Acute respiratory failure with hypoxia (HCC)   Weakness   Frequent falls   Elevated troponin   Hypokalemia   Hypomagnesemia   Hypothyroidism   Elevated BP without diagnosis of hypertension  # Acute hypoxic respiratory failure 2/2 covid, 87% in ED resolved with 2 L. Now weaned off o2 and ambulated today w/o hypoxia - monitor  # Covid-19 infection Niece reports a  couple of weeks weakness and cough worse past several days. CTA neg for PE, lungs clear. Tolerating diet - cont decadron which was started yesterday - d/c remdesevir, utility is limited at best - PT advising HH PT, rolling walker, bedside commode - daughter very concerned about discharge today given patient's weakness. Explained PT eval shows patient does not qualify for skilled nursing, should be able to function at home with some additional help. Negotiated to keep overnight, re-evaluate mobility tomorrow, if stable or improved will plan to d/c then  # Demand ischemia Peak of 579. Asymptomatic, no sig EKG changes. Cardiology consulted, advised TTE which has been obtained and is unremarkable. This appears to be demand ischemia from covid infection - outpt cardiology f/u  # Hypokalemia Resolved  # Hypothyroid TSH wnl - cont home synthroid     DVT prophylaxis: lovenox Code Status: full Family Communication: niece updated telephonically  Level of care: Telemetry Medical Status is: inpt    Consultants:  none  Procedures: none  Antimicrobials:  none    Subjective: Cough improving, ambulated with PT, tolerating diet  Objective: Vitals:   08/01/22 0300 08/01/22 0700 08/01/22 0800 08/01/22 0841  BP: (!) 153/54  (!) 126/95   Pulse: 60 (!) 59 60 64  Resp: 13 17 19    Temp: 97.8 F (36.6 C)  (!) 97.4 F (36.3 C)   TempSrc: Oral  Oral   SpO2: 95% 96% 94% 95%  Weight:      Height:       No intake or output  data in the 24 hours ending 08/01/22 1631 Filed Weights   07/30/22 2146 07/31/22 0504  Weight: 45.8 kg 45 kg    Examination:  General exam: Appears calm and comfortable  Respiratory system: Clear to auscultation. Respiratory effort normal. Cardiovascular system: S1 & S2 heard, RRR. No JVD, murmurs, rubs, gallops or clicks. No pedal edema. Gastrointestinal system: Abdomen is nondistended, soft and nontender. No organomegaly or masses felt. Normal bowel sounds  heard. Central nervous system: Alert and oriented. No focal neurological deficits. Extremities: Symmetric 5 x 5 power. Skin: No rashes, lesions or ulcers Psychiatry: Judgement and insight appear normal. Mood & affect appropriate.     Data Reviewed: I have personally reviewed following labs and imaging studies  CBC: Recent Labs  Lab 07/30/22 2230 08/01/22 0312  WBC 4.8 5.5  NEUTROABS  --  3.6  HGB 12.2 11.7*  HCT 36.4 35.6*  MCV 92.6 93.4  PLT 135* 323*   Basic Metabolic Panel: Recent Labs  Lab 07/30/22 2230 07/31/22 0831 08/01/22 0312  NA 133*  --  138  K 3.3*  --  3.5  CL 102  --  111  CO2 21*  --  19*  GLUCOSE 186*  --  124*  BUN 23  --  18  CREATININE 1.00  --  0.83  CALCIUM 8.6*  --  8.0*  MG  --  1.5* 2.0  PHOS  --  3.0 3.1   GFR: Estimated Creatinine Clearance: 27.7 mL/min (by C-G formula based on SCr of 0.83 mg/dL). Liver Function Tests: Recent Labs  Lab 07/30/22 2230 08/01/22 0312  AST 51* 47*  ALT 19 22  ALKPHOS 43 36*  BILITOT 0.5 0.3  PROT 6.4* 5.8*  ALBUMIN 3.0* 2.5*   Recent Labs  Lab 07/30/22 2230  LIPASE 46   No results for input(s): "AMMONIA" in the last 168 hours. Coagulation Profile: No results for input(s): "INR", "PROTIME" in the last 168 hours. Cardiac Enzymes: No results for input(s): "CKTOTAL", "CKMB", "CKMBINDEX", "TROPONINI" in the last 168 hours. BNP (last 3 results) No results for input(s): "PROBNP" in the last 8760 hours. HbA1C: No results for input(s): "HGBA1C" in the last 72 hours. CBG: No results for input(s): "GLUCAP" in the last 168 hours. Lipid Profile: No results for input(s): "CHOL", "HDL", "LDLCALC", "TRIG", "CHOLHDL", "LDLDIRECT" in the last 72 hours. Thyroid Function Tests: Recent Labs    07/31/22 1225  TSH 1.006   Anemia Panel: Recent Labs    07/31/22 0831  FERRITIN 94   Urine analysis:    Component Value Date/Time   COLORURINE YELLOW 07/30/2022 2232   APPEARANCEUR HAZY (A) 07/30/2022 2232    LABSPEC 1.019 07/30/2022 2232   PHURINE 5.0 07/30/2022 2232   GLUCOSEU NEGATIVE 07/30/2022 2232   HGBUR NEGATIVE 07/30/2022 2232   BILIRUBINUR NEGATIVE 07/30/2022 2232   KETONESUR 5 (A) 07/30/2022 2232   PROTEINUR 100 (A) 07/30/2022 2232   NITRITE NEGATIVE 07/30/2022 2232   LEUKOCYTESUR NEGATIVE 07/30/2022 2232   Sepsis Labs: @LABRCNTIP (procalcitonin:4,lacticidven:4)  ) Recent Results (from the past 240 hour(s))  Resp panel by RT-PCR (RSV, Flu A&B, Covid) Anterior Nasal Swab     Status: Abnormal   Collection Time: 07/30/22 10:22 PM   Specimen: Anterior Nasal Swab  Result Value Ref Range Status   SARS Coronavirus 2 by RT PCR POSITIVE (A) NEGATIVE Final    Comment: (NOTE) SARS-CoV-2 target nucleic acids are DETECTED.  The SARS-CoV-2 RNA is generally detectable in upper respiratory specimens during the acute phase of infection. Positive  results are indicative of the presence of the identified virus, but do not rule out bacterial infection or co-infection with other pathogens not detected by the test. Clinical correlation with patient history and other diagnostic information is necessary to determine patient infection status. The expected result is Negative.  Fact Sheet for Patients: BloggerCourse.com  Fact Sheet for Healthcare Providers: SeriousBroker.it  This test is not yet approved or cleared by the [place] FDA and  has been authorized for detection and/or diagnosis of SARS-CoV-2 by FDA under an Emergency Use Authorization (EUA).  This EUA will remain in effect (meaning this test can be used) for the duration of  the COVID-19 declaration under Section 564(b)(1) of the A ct, 21 U.S.C. section 360bbb-3(b)(1), unless the authorization is terminated or revoked sooner.     Influenza A by PCR NEGATIVE NEGATIVE Final   Influenza B by PCR NEGATIVE NEGATIVE Final    Comment: (NOTE) The Xpert Xpress SARS-CoV-2/FLU/RSV  plus assay is intended as an aid in the diagnosis of influenza from Nasopharyngeal swab specimens and should not be used as a sole basis for treatment. Nasal washings and aspirates are unacceptable for Xpert Xpress SARS-CoV-2/FLU/RSV testing.  Fact Sheet for Patients: BloggerCourse.com  Fact Sheet for Healthcare Providers: SeriousBroker.it  This test is not yet approved or cleared by the [place] FDA and has been authorized for detection and/or diagnosis of SARS-CoV-2 by FDA under an Emergency Use Authorization (EUA). This EUA will remain in effect (meaning this test can be used) for the duration of the COVID-19 declaration under Section 564(b)(1) of the Act, 21 U.S.C. section 360bbb-3(b)(1), unless the authorization is terminated or revoked.     Resp Syncytial Virus by PCR NEGATIVE NEGATIVE Final    Comment: (NOTE) Fact Sheet for Patients: BloggerCourse.com  Fact Sheet for Healthcare Providers: SeriousBroker.it  This test is not yet approved or cleared by the [place] FDA and has been authorized for detection and/or diagnosis of SARS-CoV-2 by FDA under an Emergency Use Authorization (EUA). This EUA will remain in effect (meaning this test can be used) for the duration of the COVID-19 declaration under Section 564(b)(1) of the Act, 21 U.S.C. section 360bbb-3(b)(1), unless the authorization is terminated or revoked.  Performed at [facility], [address], [place], [place] [address]          Radiology Studies: ECHOCARDIOGRAM COMPLETE  Result Date: 08/01/2022    ECHOCARDIOGRAM REPORT   Patient Name:   Kari Garcia Date of Exam: 08/01/2022 Medical Rec #:  [id number]      Height:       59.0 in Accession #:    [id number]     Weight:       99.2 lb Date of Birth:  1925-06-04      BSA:          1.369 m Patient Age:    89 years       BP:           153/54 mmHg  Patient Gender: F              HR:           54 bpm. Exam Location:  Inpatient Procedure: 2D Echo, Cardiac Doppler and Color Doppler Indications:    Elevated Troponin  History:        Patient has no prior history of Echocardiogram examinations.  Sonographer:    Lucendia Herrlich Referring Phys: [id number] RONDELL A SMITH IMPRESSIONS  1. Left ventricular ejection fraction, by  estimation, is 60 to 65%. The left ventricle has normal function. The left ventricle has no regional wall motion abnormalities. There is mild left ventricular hypertrophy. Left ventricular diastolic parameters are indeterminate.  2. Right ventricular systolic function is normal. The right ventricular size is normal. Tricuspid regurgitation signal is inadequate for assessing PA pressure.  3. The mitral valve is normal in structure. Mild mitral valve regurgitation. No evidence of mitral stenosis.  4. The aortic valve is tricuspid. Aortic valve regurgitation is not visualized. Aortic valve sclerosis is present, with no evidence of aortic valve stenosis.  5. The inferior vena cava is dilated in size with <50% respiratory variability, suggesting right atrial pressure of 15 mmHg. FINDINGS  Left Ventricle: Left ventricular ejection fraction, by estimation, is 60 to 65%. The left ventricle has normal function. The left ventricle has no regional wall motion abnormalities. The left ventricular internal cavity size was small. There is mild left ventricular hypertrophy. Left ventricular diastolic parameters are indeterminate. Right Ventricle: The right ventricular size is normal. No increase in right ventricular wall thickness. Right ventricular systolic function is normal. Tricuspid regurgitation signal is inadequate for assessing PA pressure. Left Atrium: Left atrial size was normal in size. Right Atrium: Right atrial size was normal in size. Pericardium: Trivial pericardial effusion is present. Mitral Valve: The mitral valve is normal in structure. Mild  mitral valve regurgitation. No evidence of mitral valve stenosis. Tricuspid Valve: The tricuspid valve is normal in structure. Tricuspid valve regurgitation is trivial. Aortic Valve: The aortic valve is tricuspid. Aortic valve regurgitation is not visualized. Aortic valve sclerosis is present, with no evidence of aortic valve stenosis. Aortic valve mean gradient measures 3.0 mmHg. Aortic valve peak gradient measures 5.7  mmHg. Aortic valve area, by VTI measures 2.04 cm. Pulmonic Valve: The pulmonic valve was not well visualized. Pulmonic valve regurgitation is trivial. Aorta: The aortic root and ascending aorta are structurally normal, with no evidence of dilitation. Venous: The inferior vena cava is dilated in size with less than 50% respiratory variability, suggesting right atrial pressure of 15 mmHg. IAS/Shunts: The interatrial septum was not well visualized.  LEFT VENTRICLE PLAX 2D LVIDd:         3.40 cm   Diastology LVIDs:         2.60 cm   LV e' medial:    6.24 cm/s LV PW:         1.20 cm   LV E/e' medial:  20.0 LV IVS:        1.20 cm   LV e' lateral:   6.08 cm/s LVOT diam:     1.80 cm   LV E/e' lateral: 20.6 LV SV:         67 LV SV Index:   49 LVOT Area:     2.54 cm                           3D Volume EF:                          3D EF:        62 %                          LV EDV:       80 ml  LV ESV:       30 ml                          LV SV:        50 ml RIGHT VENTRICLE RV Basal diam:  3.30 cm RV S prime:     9.08 cm/s TAPSE (M-mode): 1.8 cm LEFT ATRIUM             Index        RIGHT ATRIUM           Index LA diam:        3.80 cm 2.78 cm/m   RA Area:     12.40 cm LA Vol (A2C):   21.4 ml 15.63 ml/m  RA Volume:   22.10 ml  16.14 ml/m LA Vol (A4C):   38.3 ml 27.97 ml/m LA Biplane Vol: 28.6 ml 20.89 ml/m  AORTIC VALVE AV Area (Vmax):    2.17 cm AV Area (Vmean):   2.11 cm AV Area (VTI):     2.04 cm AV Vmax:           119.00 cm/s AV Vmean:          84.000 cm/s AV VTI:             0.330 m AV Peak Grad:      5.7 mmHg AV Mean Grad:      3.0 mmHg LVOT Vmax:         101.43 cm/s LVOT Vmean:        69.625 cm/s LVOT VTI:          0.264 m LVOT/AV VTI ratio: 0.80  AORTA Ao Root diam: 3.00 cm Ao Asc diam:  3.30 cm MITRAL VALVE MV Area (PHT): 3.17 cm     SHUNTS MV Decel Time: 239 msec     Systemic VTI:  0.26 m MV E velocity: 125.00 cm/s  Systemic Diam: 1.80 cm MV A velocity: 69.20 cm/s MV E/A ratio:  1.81 Oswaldo Milian MD Electronically signed by Oswaldo Milian MD Signature Date/Time: 08/01/2022/2:04:01 PM    Final    CT Angio Chest Pulmonary Embolism (PE) W or WO Contrast  Result Date: 07/31/2022 CLINICAL DATA:  Generalized weakness with some cough and nasal congestion 1 week. Possible pulmonary embolism. EXAM: CT ANGIOGRAPHY CHEST WITH CONTRAST TECHNIQUE: Multidetector CT imaging of the chest was performed using the standard protocol during bolus administration of intravenous contrast. Multiplanar CT image reconstructions and MIPs were obtained to evaluate the vascular anatomy. RADIATION DOSE REDUCTION: This exam was performed according to the departmental dose-optimization program which includes automated exposure control, adjustment of the mA and/or kV according to patient size and/or use of iterative reconstruction technique. CONTRAST:  69mL OMNIPAQUE IOHEXOL 350 MG/ML SOLN COMPARISON:  Chest x-ray 07/30/2022 FINDINGS: Cardiovascular: Mild cardiomegaly. Calcified plaque over the left main and 3 vessel coronary arteries. Thoracic aorta is normal caliber. There is calcified plaque over the thoracic aorta. Pulmonary arterial system is well opacified without evidence of emboli. Remaining vascular structures are unremarkable. Mediastinum/Nodes: No mediastinal or hilar adenopathy. Remaining mediastinal structures are unremarkable. Lungs/Pleura: Lungs are normally inflated with very minimal patchy linear density within the lung bases and lingula compatible with atelectasis/scarring.  Subtle dependent atelectasis over the posterior right upper lobe. No acute airspace process. No effusion. Airways are normal. Upper Abdomen: Calcified plaque over the abdominal aorta. No acute findings. Musculoskeletal: Moderate compression fracture in the region of the thoracolumbar junction with  mild compression deformity of the vertebral body just above this level. Age of these compression deformities is indeterminate. Review of the MIP images confirms the above findings. IMPRESSION: 1. No acute cardiopulmonary disease and no evidence of pulmonary emboli. Mild patchy atelectasis versus scarring. 2. Mild cardiomegaly. Atherosclerotic coronary artery disease. 3. Moderate compression fracture in the region of the thoracolumbar junction with mild compression deformity of the vertebral body just above this level. Age of these compression deformities is indeterminate. 4. Aortic atherosclerosis. Aortic Atherosclerosis (ICD10-I70.0). Electronically Signed   By: Elberta Fortis M.D.   On: 07/31/2022 16:27   CT HEAD WO CONTRAST ( )  Result Date: 07/30/2022 CLINICAL DATA:  Altered mental status with prior falls. EXAM: CT HEAD WITHOUT CONTRAST TECHNIQUE: Contiguous axial images were obtained from the base of the skull through the vertex without intravenous contrast. RADIATION DOSE REDUCTION: This exam was performed according to the departmental dose-optimization program which includes automated exposure control, adjustment of the mA and/or kV according to patient size and/or use of iterative reconstruction technique. COMPARISON:  None Available. FINDINGS: Brain: There is mild to moderate severity cerebral atrophy with widening of the extra-axial spaces and ventricular dilatation. There are areas of decreased attenuation within the white matter tracts of the supratentorial brain, consistent with microvascular disease changes. Vascular: No hyperdense vessel or unexpected calcification. Skull: Normal. Negative for fracture  or focal lesion. Sinuses/Orbits: No acute finding. Other: None. IMPRESSION: 1. No acute intracranial abnormality. 2. Cerebral atrophy and microvascular disease changes of the supratentorial brain. Electronically Signed   By: Aram Candela M.D.   On: 07/30/2022 23:04   DG Chest 2 View  Result Date: 07/30/2022 CLINICAL DATA:  Fatigue EXAM: CHEST - 2 VIEW COMPARISON:  Chest x-ray 09/15/2004 FINDINGS: The heart is mildly enlarged. There is no focal lung consolidation, pleural effusion or pneumothorax. There are degenerative changes throughout the spine. IMPRESSION: 1. No active cardiopulmonary disease. 2. Mild cardiomegaly. Electronically Signed   By: Darliss Cheney M.D.   On: 07/30/2022 22:51        Scheduled Meds:  vitamin C  500 mg Oral Daily   dexamethasone  6 mg Oral Q24H   dorzolamide  1 drop Both Eyes BID   enoxaparin (LOVENOX) injection  30 mg Subcutaneous Q24H   famotidine  10 mg Oral BID   guaiFENesin  600 mg Oral BID   levothyroxine  25 mcg Oral Q0600   sodium chloride flush  3 mL Intravenous Q12H   timolol  1 drop Both Eyes BID   zinc sulfate  220 mg Oral Daily   Continuous Infusions:  remdesivir 100 mg in sodium chloride 0.9 % 100 mL IVPB 100 mg (08/01/22 1011)     LOS: 0 days     Silvano Bilis, MD [facility]   If 7PM-7AM, please contact night-coverage [email or web address] Password [facility] 08/01/2022, 4:31 PM

## 2022-08-01 NOTE — Progress Notes (Signed)
Rounding Note    Patient Name: Kari Garcia Date of Encounter: 08/01/2022  [place] Cardiologist: None   Subjective   BP 126/95.  Denies any chest pain   Inpatient Medications    Scheduled Meds:  vitamin C  500 mg Oral Daily   dexamethasone  6 mg Oral Q24H   dorzolamide  1 drop Both Eyes BID   enoxaparin (LOVENOX) injection  30 mg Subcutaneous Q24H   famotidine  20 mg Oral BID   guaiFENesin  600 mg Oral BID   levothyroxine  25 mcg Oral Q0600   sodium chloride flush  3 mL Intravenous Q12H   timolol  1 drop Both Eyes BID   zinc sulfate  220 mg Oral Daily   Continuous Infusions:  remdesivir 100 mg in sodium chloride 0.9 % 100 mL IVPB 100 mg (08/01/22 1011)   PRN Meds: acetaminophen, albuterol, guaiFENesin-dextromethorphan, ondansetron **OR** ondansetron (ZOFRAN) IV   Vital Signs    Vitals:   08/01/22 0300 08/01/22 0700 08/01/22 0800 08/01/22 0841  BP: (!) 153/54  (!) 126/95   Pulse: 60 (!) 59 60 64  Resp: 13 17 19    Temp: 97.8 F (36.6 C)  (!) 97.4 F (36.3 C)   TempSrc: Oral  Oral   SpO2: 95% 96% 94% 95%  Weight:      Height:        Intake/Output Summary (Last 24 hours) at 08/01/2022 1124 Last data filed at 07/31/2022 1525 Gross per 24 hour  Intake 50 ml  Output --  Net 50 ml      07/31/2022    5:04 AM 07/30/2022    9:46 PM 10/07/2017    9:55 AM  Last 3 Weights  Weight (lbs) 99 lb 3.3 oz 101 lb 106 lb  Weight (kg) 45 kg 45.813 kg 48.081 kg      Telemetry    NSR - Personally Reviewed  ECG    No new ECG - Personally Reviewed  Physical Exam   GEN: No acute distress.   Neck: No JVD Cardiac: RRR, no murmurs, rubs, or gallops.  Respiratory: Clear to auscultation bilaterally. GI: Soft, nontender, non-distended  MS: No edema; No deformity. Neuro:  Nonfocal  Psych: Normal affect   Labs    High Sensitivity Troponin:   Recent Labs  Lab 07/31/22 0831 07/31/22 1253  TROPONINIHS 579* 368*     Chemistry Recent Labs  Lab  07/30/22 2230 07/31/22 0831 08/01/22 0312  NA 133*  --  138  K 3.3*  --  3.5  CL 102  --  111  CO2 21*  --  19*  GLUCOSE 186*  --  124*  BUN 23  --  18  CREATININE 1.00  --  0.83  CALCIUM 8.6*  --  8.0*  MG  --  1.5* 2.0  PROT 6.4*  --  5.8*  ALBUMIN 3.0*  --  2.5*  AST 51*  --  47*  ALT 19  --  22  ALKPHOS 43  --  36*  BILITOT 0.5  --  0.3  GFRNONAA 52*  --  >60  ANIONGAP 10  --  8    Lipids No results for input(s): "CHOL", "TRIG", "HDL", "LABVLDL", "LDLCALC", "CHOLHDL" in the last 168 hours.  Hematology Recent Labs  Lab 07/30/22 2230 08/01/22 0312  WBC 4.8 5.5  RBC 3.93 3.81*  HGB 12.2 11.7*  HCT 36.4 35.6*  MCV 92.6 93.4  MCH 31.0 30.7  MCHC 33.5 32.9  RDW  12.4 12.6  PLT 135* 127*   Thyroid  Recent Labs  Lab 07/31/22 1225  TSH 1.006    BNP Recent Labs  Lab 07/31/22 0831  BNP 509.1*    DDimer  Recent Labs  Lab 07/31/22 0831 08/01/22 0312  DDIMER 11.39* 6.22*     Radiology    CT Angio Chest Pulmonary Embolism (PE) W or WO Contrast  Result Date: 07/31/2022 CLINICAL DATA:  Generalized weakness with some cough and nasal congestion 1 week. Possible pulmonary embolism. EXAM: CT ANGIOGRAPHY CHEST WITH CONTRAST TECHNIQUE: Multidetector CT imaging of the chest was performed using the standard protocol during bolus administration of intravenous contrast. Multiplanar CT image reconstructions and MIPs were obtained to evaluate the vascular anatomy. RADIATION DOSE REDUCTION: This exam was performed according to the departmental dose-optimization program which includes automated exposure control, adjustment of the mA and/or kV according to patient size and/or use of iterative reconstruction technique. CONTRAST:  55mL OMNIPAQUE IOHEXOL 350 MG/ML SOLN COMPARISON:  Chest x-ray 07/30/2022 FINDINGS: Cardiovascular: Mild cardiomegaly. Calcified plaque over the left main and 3 vessel coronary arteries. Thoracic aorta is normal caliber. There is calcified plaque over the  thoracic aorta. Pulmonary arterial system is well opacified without evidence of emboli. Remaining vascular structures are unremarkable. Mediastinum/Nodes: No mediastinal or hilar adenopathy. Remaining mediastinal structures are unremarkable. Lungs/Pleura: Lungs are normally inflated with very minimal patchy linear density within the lung bases and lingula compatible with atelectasis/scarring. Subtle dependent atelectasis over the posterior right upper lobe. No acute airspace process. No effusion. Airways are normal. Upper Abdomen: Calcified plaque over the abdominal aorta. No acute findings. Musculoskeletal: Moderate compression fracture in the region of the thoracolumbar junction with mild compression deformity of the vertebral body just above this level. Age of these compression deformities is indeterminate. Review of the MIP images confirms the above findings. IMPRESSION: 1. No acute cardiopulmonary disease and no evidence of pulmonary emboli. Mild patchy atelectasis versus scarring. 2. Mild cardiomegaly. Atherosclerotic coronary artery disease. 3. Moderate compression fracture in the region of the thoracolumbar junction with mild compression deformity of the vertebral body just above this level. Age of these compression deformities is indeterminate. 4. Aortic atherosclerosis. Aortic Atherosclerosis (ICD10-I70.0). Electronically Signed   By: Marin Olp M.D.   On: 07/31/2022 16:27   CT HEAD WO CONTRAST (5MM)  Result Date: 07/30/2022 CLINICAL DATA:  Altered mental status with prior falls. EXAM: CT HEAD WITHOUT CONTRAST TECHNIQUE: Contiguous axial images were obtained from the base of the skull through the vertex without intravenous contrast. RADIATION DOSE REDUCTION: This exam was performed according to the departmental dose-optimization program which includes automated exposure control, adjustment of the mA and/or kV according to patient size and/or use of iterative reconstruction technique. COMPARISON:   None Available. FINDINGS: Brain: There is mild to moderate severity cerebral atrophy with widening of the extra-axial spaces and ventricular dilatation. There are areas of decreased attenuation within the white matter tracts of the supratentorial brain, consistent with microvascular disease changes. Vascular: No hyperdense vessel or unexpected calcification. Skull: Normal. Negative for fracture or focal lesion. Sinuses/Orbits: No acute finding. Other: None. IMPRESSION: 1. No acute intracranial abnormality. 2. Cerebral atrophy and microvascular disease changes of the supratentorial brain. Electronically Signed   By: Virgina Norfolk M.D.   On: 07/30/2022 23:04   DG Chest 2 View  Result Date: 07/30/2022 CLINICAL DATA:  Fatigue EXAM: CHEST - 2 VIEW COMPARISON:  Chest x-ray 09/15/2004 FINDINGS: The heart is mildly enlarged. There is no focal lung consolidation, pleural effusion  or pneumothorax. There are degenerative changes throughout the spine. IMPRESSION: 1. No active cardiopulmonary disease. 2. Mild cardiomegaly. Electronically Signed   By: Darliss Cheney M.D.   On: 07/30/2022 22:51    Cardiac Studies     Patient Profile     89 y.o. female with a hx of hypothyroidism who is being seen  for the evaluation of elevated troponin   Assessment & Plan    Elevated troponin: troponin 579> 368.  In setting of COVID-19 infection and markedly elevated BP on presentation.  She denies any chest pain.  Suspect likely demand ischemia. -Check echocardiogram to evaluate for new wall motion abnormality.  If echo unremarkable, no ischemic evaluation planned   COVID-19 infection: Treatment per primary team   Acute hypoxic respiratory failure: Likely due to COVID-19 infection as above.  CTPA showed no PE   Elevated BP: No history of hypertension, but BP has been elevated.  Likely related to steroid treatment for COVID, will monitor  For questions or updates, please contact [facility] Please consult  [email or web address] for contact info under        Signed, Little Ishikawa, MD  08/01/2022, 11:24 AM

## 2022-08-01 NOTE — Progress Notes (Signed)
PT Cancellation Note  Patient Details Name: Kari Garcia MRN: [id number] DOB: Dec 23, 1924   Cancelled Treatment:    Reason Eval/Treat Not Completed: Patient at procedure or test/unavailable  Will follow up later today as time allows;  Otherwise, will follow up for PT tomorrow;   Thank you,  Roney Marion, [place] [employer] 08/01/2022, 11:57 AM

## 2022-08-01 NOTE — Evaluation (Signed)
Physical Therapy Evaluation Patient Details Name: Kari Garcia MRN: [id number] DOB: September 23, 1924 Today's Date: 08/01/2022  History of Present Illness  Kari Garcia is a 89 y.o. female presenting with weakness after having multiple falls.  At baseline patient ambulates normally without assistance and carries out her ADLs without need of assistance.  In the last couple days leadingup to this admission she reports having several falls with complaints of weakness. She is able to get herself back up reported subsequently again. Approximately 2 weeks prior to admission, she started developing nasal congestion and sinus pressure with complaints of ear discomfort.  She reports having a productive cough, but he is unable to cough anything up.  Patient notes that over the last couple days she has started to feel unwell.  She complains of having subjective fever/chills, sleeping more than usual, nausea with decreased p.o. intake, and felt like head congestion had moved into her chest. tested positive for covid; with medical history significant of hypothyroidism  Clinical Impression   Pt admitted with above diagnosis. Lives at home alone, in a single-level home with a few steps to enter; Prior to admission, pt was able to manage independently, no device to walk, still driving; Her frequent falls were in the 2 weeks leading up to this admission; Presents to PT with generalized weakness, decr balance and decr activity tolerance, high fall risk;  Pt currently with functional limitations due to the deficits listed below (see PT Problem List). Pt will benefit from skilled PT to increase their independence and safety with mobility to allow discharge to the venue listed below.          Recommendations for follow up therapy are one component of a multi-disciplinary discharge planning process, led by the attending physician.  Recommendations may be updated based on patient status, additional functional criteria and  insurance authorization.  Follow Up Recommendations Home health PT (lives alone, and family can provide what sounds like consistent daily check ins, but prn, not 24 hour coverage; she will need to be much steadier with standing and amb than she is today on eval; Hopeful that as she improves medically, her functional status will improve quickly, and she can get home)      Assistance Recommended at Discharge Frequent or constant Supervision/Assistance  Patient can return home with the following  A little help with walking and/or transfers;Assistance with cooking/housework;Help with stairs or ramp for entrance    Equipment Recommendations Rolling walker (2 wheels);BSC/3in1 (will consider rollator vs RW)  Recommendations for Other Services  OT consult (as ordered)    Functional Status Assessment Patient has had a recent decline in their functional status and demonstrates the ability to make significant improvements in function in a reasonable and predictable amount of time.     Precautions / Restrictions Precautions Precautions: Fall Precaution Comments: air/con Covid Restrictions Weight Bearing Restrictions: No      Mobility  Bed Mobility Overal bed mobility: Needs Assistance Bed Mobility: Supine to Sit     Supine to sit: Min assist     General bed mobility comments: min handheld assist to pull to sit    Transfers Overall transfer level: Needs assistance Equipment used: 1 person hand held assist Transfers: Sit to/from Stand Sit to Stand: Mod assist           General transfer comment: Mod assist to power up and steady; noted pt using backs of legs against the bed to brace and steady herself    Ambulation/Gait Ambulation/Gait assistance: Min assist,  Mod assist, +2 safety/equipment Gait Distance (Feet): 24 Feet Assistive device: 2 person hand held assist Gait Pattern/deviations: Step-through pattern, Decreased step length - right, Decreased step length - left Gait  velocity: slowed, but mostly slwoed due to need to wait on IV pole, lines, and leads     General Gait Details: Mild Posterior lean present; necessary to have min and occasional mod assist to maintain balance while walking; VSS on room air  Stairs            Wheelchair Mobility    Modified Rankin (Stroke Patients Only)       Balance Overall balance assessment: Needs assistance   Sitting balance-Leahy Scale: Fair       Standing balance-Leahy Scale: Poor Standing balance comment: persistent mild posterior lean with standing and gait                             Pertinent Vitals/Pain Pain Assessment Pain Assessment: No/denies pain    Home Living Family/patient expects to be discharged to:: Private residence Living Arrangements: Alone;Other relatives Available Help at Discharge: Family;Available PRN/intermittently Type of Home: House Home Access: Stairs to enter   [employer] of Steps: 2 (through sunroom entrance, then 1 step into [place])   Home Layout: One level   Additional Comments: Will need more info re: bathroom setup; Family states sometimes they worry that she does not ask for help when she needs it    Prior Function Prior Level of Function : Independent/Modified Independent             Mobility Comments: no assistive device, no difficulty, still drives ADLs Comments: independent     Hand Dominance        Extremity/Trunk Assessment   Upper Extremity Assessment Upper Extremity Assessment: Defer to OT evaluation    Lower Extremity Assessment Lower Extremity Assessment: Generalized weakness       Communication   Communication: No difficulties  Cognition Arousal/Alertness: Awake/alert Behavior During Therapy: WFL for tasks assessed/performed Overall Cognitive Status: Impaired/Different from baseline Area of Impairment: Safety/judgement                         Safety/Judgement: Decreased awareness of  safety, Decreased awareness of deficits              General Comments General comments (skin integrity, edema, etc.): Family present during eval    Exercises     Assessment/Plan    PT Assessment Patient needs continued PT services  PT Problem List Decreased strength;Decreased activity tolerance;Decreased balance;Decreased mobility;Decreased coordination;Decreased knowledge of use of DME;Decreased safety awareness;Decreased knowledge of precautions       PT Treatment Interventions DME instruction;Gait training;Stair training;Functional mobility training;Therapeutic activities;Therapeutic exercise;Balance training;Neuromuscular re-education;Patient/family education    PT Goals (Current goals can be found in the Care Plan section)  Acute Rehab PT Goals Patient Stated Goal: Independence; and REALLY wants to shower PT Goal Formulation: With patient Time For Goal Achievement: 08/15/22 Potential to Achieve Goals: Good    Frequency Min 4X/week     Co-evaluation               AM-PAC PT "6 Clicks" Mobility  Outcome Measure Help needed turning from your back to your side while in a flat bed without using bedrails?: None Help needed moving from lying on your back to sitting on the side of a flat bed without using bedrails?: A Little Help needed moving  to and from a bed to a chair (including a wheelchair)?: A Little Help needed standing up from a chair using your arms (e.g., wheelchair or bedside chair)?: A Lot Help needed to walk in hospital room?: A Lot Help needed climbing 3-5 steps with a railing? : A Lot 6 Click Score: 16    End of Session Equipment Utilized During Treatment: Gait belt Activity Tolerance: Patient tolerated treatment well Patient left: in chair;with call bell/phone within reach;with chair alarm set;with family/visitor present Nurse Communication: Mobility status (wants to shower) PT Visit Diagnosis: Unsteadiness on feet (R26.81);Muscle weakness  (generalized) (M62.81);Repeated falls (R29.6)    Time: 9678-9381 PT Time Calculation (min) (ACUTE ONLY): 29 min   Charges:   PT Evaluation $PT Eval Moderate Complexity: 1 Mod PT Treatments $Gait Training: 8-22 mins        Van Clines, PT  Acute Rehabilitation Services Office [phone]   Levi Aland 08/01/2022, 2:49 PM

## 2022-08-01 NOTE — Plan of Care (Signed)

## 2022-08-02 DIAGNOSIS — U071 COVID-19: Secondary | ICD-10-CM | POA: Diagnosis not present

## 2022-08-02 LAB — C-REACTIVE PROTEIN: CRP: 0.5 mg/dL (ref <1.0)

## 2022-08-02 LAB — CBC WITH DIFFERENTIAL/PLATELET
Abs Immature Granulocytes: 0.05 10*3/uL (ref 0.00–0.07)
Basophils Absolute: 0 10*3/uL (ref 0.0–0.1)
Basophils Relative: 0 %
Eosinophils Absolute: 0 10*3/uL (ref 0.0–0.5)
Eosinophils Relative: 0 %
HCT: 35.2 % — ABNORMAL LOW (ref 36.0–46.0)
Hemoglobin: 12 g/dL (ref 12.0–15.0)
Immature Granulocytes: 1 %
Lymphocytes Relative: 27 %
Lymphs Abs: 1.8 10*3/uL (ref 0.7–4.0)
MCH: 31.3 pg (ref 26.0–34.0)
MCHC: 34.1 g/dL (ref 30.0–36.0)
MCV: 91.9 fL (ref 80.0–100.0)
Monocytes Absolute: 0.6 10*3/uL (ref 0.1–1.0)
Monocytes Relative: 9 %
Neutro Abs: 4.3 10*3/uL (ref 1.7–7.7)
Neutrophils Relative %: 63 %
Platelets: 135 10*3/uL — ABNORMAL LOW (ref 150–400)
RBC: 3.83 MIL/uL — ABNORMAL LOW (ref 3.87–5.11)
RDW: 12.7 % (ref 11.5–15.5)
WBC: 6.7 10*3/uL (ref 4.0–10.5)
nRBC: 0 % (ref 0.0–0.2)

## 2022-08-02 LAB — COMPREHENSIVE METABOLIC PANEL
ALT: 22 U/L (ref 0–44)
AST: 42 U/L — ABNORMAL HIGH (ref 15–41)
Albumin: 2.5 g/dL — ABNORMAL LOW (ref 3.5–5.0)
Alkaline Phosphatase: 38 U/L (ref 38–126)
Anion gap: 7 (ref 5–15)
BUN: 22 mg/dL (ref 8–23)
CO2: 20 mmol/L — ABNORMAL LOW (ref 22–32)
Calcium: 7.9 mg/dL — ABNORMAL LOW (ref 8.9–10.3)
Chloride: 112 mmol/L — ABNORMAL HIGH (ref 98–111)
Creatinine, Ser: 0.95 mg/dL (ref 0.44–1.00)
GFR, Estimated: 55 mL/min — ABNORMAL LOW (ref >60)
Glucose, Bld: 123 mg/dL — ABNORMAL HIGH (ref 70–99)
Potassium: 3.6 mmol/L (ref 3.5–5.1)
Sodium: 139 mmol/L (ref 135–145)
Total Bilirubin: 0.4 mg/dL (ref 0.3–1.2)
Total Protein: 5.6 g/dL — ABNORMAL LOW (ref 6.5–8.1)

## 2022-08-02 LAB — D-DIMER, QUANTITATIVE: D-Dimer, Quant: 2.93 ug/mL-FEU — ABNORMAL HIGH (ref 0.00–0.50)

## 2022-08-02 LAB — GLUCOSE, CAPILLARY: Glucose-Capillary: 241 mg/dL — ABNORMAL HIGH (ref 70–99)

## 2022-08-02 NOTE — Discharge Summary (Signed)
Kari Garcia AOZ:[id number] DOB: 05/08/1925 DOA: 07/30/2022  PCP: Kari Garcia, Kari W, MD  Admit date: 07/30/2022 Discharge date: 08/02/2022  Time spent: 35 minutes  Recommendations for Outpatient Follow-up:  Pcp f/u 1-2 weeks  Consider cardiology f/u (demand ischemia this hospitalization)   Discharge Diagnoses:  Principal Problem:   COVID-19 virus infection Active Problems:   Acute respiratory failure with hypoxia (HCC)   Weakness   Frequent falls   Elevated troponin   Hypokalemia   Hypomagnesemia   Hypothyroidism   Elevated BP without diagnosis of hypertension   Discharge Condition: improved  Diet recommendation: regular  Filed Weights   07/30/22 2146 07/31/22 0504  Weight: 45.8 kg 45 kg    History of present illness:  From admission h and p Kari OsmondRebecca Garcia is a 89 y.o. female with medical history significant of hypothyroidism presenting with weakness after having multiple falls.  At baseline patient ambulates normally without assistance and carries out her ADLs without need of assistance.  In the last couple days she reports having several falls with complaints of weakness.  She reports falling once while in the house where she tripped over a cord.  Then fell while going to the end of her driveway despite trying to use a broom to steady herself.  She is able to get herself back up reported subsequently again. Approximately 2 weeks ago she started developing nasal congestion and sinus pressure with complaints of ear discomfort.  She reports having a productive cough, but he is unable to cough anything up.  Patient notes that over the last couple days she has started to feel unwell.  She complains of having subjective fever/chills, sleeping more than usual, nausea with decreased p.o. intake, and felt like head congestion had moved into her chest.  Denies having any focal weakness, change in vision, slurred speech, vomiting, diarrhea, chest pain, leg swelling, calf pain,  lightheadedness, or dysuria symptoms.  She had initially went to urgent care due to the frequent falls and was sent to [facility] for further evaluation.   In the emergency department patient was noted to have temperature of 99 F with tachypnea, blood pressures anywhere from 139/56 to 193/83, and O2 saturations documented as low as 87% with improvement on 2 L of nasal cannula oxygen to greater than 92%.  Labs significant for platelets 135 sodium 133, potassium 3.3, BUN 23, creatinine 1, glucose 186, AST 51, and lactic acid 1.3.. COVID-19 screening was positive.  Urinalysis did not show any acute abnormality.  Chest x-ray noted mild cardiomegaly without acute disease.  CT imaging of the brain did not note any acute abnormality with cerebral atrophy and microvascular disease changes of the supratentorial brain..  Patient has been given Decadron 6 mg p.o. and started on remdesivir IV.  Hospital Course:   Patient presents with cough, found to be hypoxic and with covid-19 infection. Hypoxemia resolved and patient weaned off oxygen and ambulated without hypoxemia. Treated with one day remdesevir which was later discontinued, also treated with decadron which was continued throughout her hospital course. Ambulated with PT and deemed fit for discharge with home health pt/ot and rolling walker and bedside commode which have been ordered. Patient also found to have demand ischemia with troponin peak of 579 which was asymptomatic. Cardiology evaluated, obtained TTE which was without significant findings, they deemed patient suitable for discharge, can consider outpatient f/u with them. Symptomatically patient is feeling much improved.    Procedures: none   Consultations: cardiology  Discharge Exam: Vitals:  08/02/22 0354 08/02/22 0400  BP: (!) 153/57 (!) 150/50  Pulse: (!) 46 (!) 45  Resp: 12 13  Temp: 98 F (36.7 C)   SpO2: 96% 94%    General: NAD Cardiovascular: RRR Respiratory: CTAB save for few  faint scattered rales  Discharge Instructions   Discharge Instructions     DME Bedside commode   Complete by: As directed    Patient needs a bedside commode to treat with the following condition: Age-related physical debility   Diet - low sodium heart healthy   Complete by: As directed    Increase activity slowly   Complete by: As directed       Allergies as of 08/02/2022       Reactions   Codeine    REACTION: nausea and vomiting        Medication List     TAKE these medications    acetaminophen 500 MG tablet Commonly known as: TYLENOL Take 500 mg by mouth every 6 (six) hours as needed for moderate pain.   alendronate 70 MG tablet Commonly known as: FOSAMAX Take 70 mg by mouth once a week. Saturday   dorzolamide 2 % ophthalmic solution Commonly known as: TRUSOPT Place 1 drop into both eyes 2 (two) times daily.   levothyroxine 25 MCG tablet Commonly known as: SYNTHROID Take 25 mcg by mouth daily before breakfast.   timolol 0.5 % ophthalmic solution Commonly known as: BETIMOL 1 drop 2 (two) times daily.   Vitamin D (Ergocalciferol) 1.25 MG (50000 UNIT) Caps capsule Commonly known as: DRISDOL Take 50,000 Units by mouth every 7 (seven) days. Tuesday               Durable Medical Equipment  (From admission, onward)           Start     Ordered   08/02/22 0856  DME Gilford Rile  Once       Question Answer Comment  Walker: With 5 Inch Wheels   Patient needs a walker to treat with the following condition Age-related physical debility      08/02/22 0856   08/02/22 0000  DME Bedside commode       Question:  Patient needs a bedside commode to treat with the following condition  Answer:  Age-related physical debility   08/02/22 0856           Allergies  Allergen Reactions   Codeine     REACTION: nausea and vomiting    Follow-up Information     Garcia, Kari Him, MD Follow up.   Specialty: Internal Medicine Contact information: [place]  [phone]                  The results of significant diagnostics from this hospitalization (including imaging, microbiology, ancillary and laboratory) are listed below for reference.    Significant Diagnostic Studies: ECHOCARDIOGRAM COMPLETE  Result Date: 08/01/2022    ECHOCARDIOGRAM REPORT   Patient Name:   Shamina Siebenaler Date of Exam: 08/01/2022 Medical Rec #:  [id number]      Height:       59.0 in Accession #:    [id number]     Weight:       99.2 lb Date of Birth:  02-Apr-1925      BSA:          1.369 m Patient Age:    89 years       BP:  153/54 mmHg Patient Gender: F              HR:           54 bpm. Exam Location:  Inpatient Procedure: 2D Echo, Cardiac Doppler and Color Doppler Indications:    Elevated Troponin  History:        Patient has no prior history of Echocardiogram examinations.  Sonographer:    Ronny Flurry Referring Phys: [id number] RONDELL A SMITH IMPRESSIONS  1. Left ventricular ejection fraction, by estimation, is 60 to 65%. The left ventricle has normal function. The left ventricle has no regional wall motion abnormalities. There is mild left ventricular hypertrophy. Left ventricular diastolic parameters are indeterminate.  2. Right ventricular systolic function is normal. The right ventricular size is normal. Tricuspid regurgitation signal is inadequate for assessing PA pressure.  3. The mitral valve is normal in structure. Mild mitral valve regurgitation. No evidence of mitral stenosis.  4. The aortic valve is tricuspid. Aortic valve regurgitation is not visualized. Aortic valve sclerosis is present, with no evidence of aortic valve stenosis.  5. The inferior vena cava is dilated in size with <50% respiratory variability, suggesting right atrial pressure of 15 mmHg. FINDINGS  Left Ventricle: Left ventricular ejection fraction, by estimation, is 60 to 65%. The left ventricle has normal function. The left ventricle has no regional wall  motion abnormalities. The left ventricular internal cavity size was small. There is mild left ventricular hypertrophy. Left ventricular diastolic parameters are indeterminate. Right Ventricle: The right ventricular size is normal. No increase in right ventricular wall thickness. Right ventricular systolic function is normal. Tricuspid regurgitation signal is inadequate for assessing PA pressure. Left Atrium: Left atrial size was normal in size. Right Atrium: Right atrial size was normal in size. Pericardium: Trivial pericardial effusion is present. Mitral Valve: The mitral valve is normal in structure. Mild mitral valve regurgitation. No evidence of mitral valve stenosis. Tricuspid Valve: The tricuspid valve is normal in structure. Tricuspid valve regurgitation is trivial. Aortic Valve: The aortic valve is tricuspid. Aortic valve regurgitation is not visualized. Aortic valve sclerosis is present, with no evidence of aortic valve stenosis. Aortic valve mean gradient measures 3.0 mmHg. Aortic valve peak gradient measures 5.7  mmHg. Aortic valve area, by VTI measures 2.04 cm. Pulmonic Valve: The pulmonic valve was not well visualized. Pulmonic valve regurgitation is trivial. Aorta: The aortic root and ascending aorta are structurally normal, with no evidence of dilitation. Venous: The inferior vena cava is dilated in size with less than 50% respiratory variability, suggesting right atrial pressure of 15 mmHg. IAS/Shunts: The interatrial septum was not well visualized.  LEFT VENTRICLE PLAX 2D LVIDd:         3.40 cm   Diastology LVIDs:         2.60 cm   LV e' medial:    6.24 cm/s LV PW:         1.20 cm   LV E/e' medial:  20.0 LV IVS:        1.20 cm   LV e' lateral:   6.08 cm/s LVOT diam:     1.80 cm   LV E/e' lateral: 20.6 LV SV:         67 LV SV Index:   49 LVOT Area:     2.54 cm                           3D Volume EF:  3D EF:        62 %                          LV EDV:       80 ml                           LV ESV:       30 ml                          LV SV:        50 ml RIGHT VENTRICLE RV Basal diam:  3.30 cm RV S prime:     9.08 cm/s TAPSE (M-mode): 1.8 cm LEFT ATRIUM             Index        RIGHT ATRIUM           Index LA diam:        3.80 cm 2.78 cm/m   RA Area:     12.40 cm LA Vol (A2C):   21.4 ml 15.63 ml/m  RA Volume:   22.10 ml  16.14 ml/m LA Vol (A4C):   38.3 ml 27.97 ml/m LA Biplane Vol: 28.6 ml 20.89 ml/m  AORTIC VALVE AV Area (Vmax):    2.17 cm AV Area (Vmean):   2.11 cm AV Area (VTI):     2.04 cm AV Vmax:           119.00 cm/s AV Vmean:          84.000 cm/s AV VTI:            0.330 m AV Peak Grad:      5.7 mmHg AV Mean Grad:      3.0 mmHg LVOT Vmax:         101.43 cm/s LVOT Vmean:        69.625 cm/s LVOT VTI:          0.264 m LVOT/AV VTI ratio: 0.80  AORTA Ao Root diam: 3.00 cm Ao Asc diam:  3.30 cm MITRAL VALVE MV Area (PHT): 3.17 cm     SHUNTS MV Decel Time: 239 msec     Systemic VTI:  0.26 m MV E velocity: 125.00 cm/s  Systemic Diam: 1.80 cm MV A velocity: 69.20 cm/s MV E/A ratio:  1.81 Oswaldo Milian MD Electronically signed by Oswaldo Milian MD Signature Date/Time: 08/01/2022/2:04:01 PM    Final    CT Angio Chest Pulmonary Embolism (PE) W or WO Contrast  Result Date: 07/31/2022 CLINICAL DATA:  Generalized weakness with some cough and nasal congestion 1 week. Possible pulmonary embolism. EXAM: CT ANGIOGRAPHY CHEST WITH CONTRAST TECHNIQUE: Multidetector CT imaging of the chest was performed using the standard protocol during bolus administration of intravenous contrast. Multiplanar CT image reconstructions and MIPs were obtained to evaluate the vascular anatomy. RADIATION DOSE REDUCTION: This exam was performed according to the departmental dose-optimization program which includes automated exposure control, adjustment of the mA and/or kV according to patient size and/or use of iterative reconstruction technique. CONTRAST:  97mL OMNIPAQUE IOHEXOL 350 MG/ML SOLN  COMPARISON:  Chest x-ray 07/30/2022 FINDINGS: Cardiovascular: Mild cardiomegaly. Calcified plaque over the left main and 3 vessel coronary arteries. Thoracic aorta is normal caliber. There is calcified plaque over the thoracic aorta. Pulmonary arterial system is well opacified without evidence of emboli. Remaining vascular structures are unremarkable. Mediastinum/Nodes:  No mediastinal or hilar adenopathy. Remaining mediastinal structures are unremarkable. Lungs/Pleura: Lungs are normally inflated with very minimal patchy linear density within the lung bases and lingula compatible with atelectasis/scarring. Subtle dependent atelectasis over the posterior right upper lobe. No acute airspace process. No effusion. Airways are normal. Upper Abdomen: Calcified plaque over the abdominal aorta. No acute findings. Musculoskeletal: Moderate compression fracture in the region of the thoracolumbar junction with mild compression deformity of the vertebral body just above this level. Age of these compression deformities is indeterminate. Review of the MIP images confirms the above findings. IMPRESSION: 1. No acute cardiopulmonary disease and no evidence of pulmonary emboli. Mild patchy atelectasis versus scarring. 2. Mild cardiomegaly. Atherosclerotic coronary artery disease. 3. Moderate compression fracture in the region of the thoracolumbar junction with mild compression deformity of the vertebral body just above this level. Age of these compression deformities is indeterminate. 4. Aortic atherosclerosis. Aortic Atherosclerosis (ICD10-I70.0). Electronically Signed   By: Elberta Fortis M.D.   On: 07/31/2022 16:27   CT HEAD WO CONTRAST ( )  Result Date: 07/30/2022 CLINICAL DATA:  Altered mental status with prior falls. EXAM: CT HEAD WITHOUT CONTRAST TECHNIQUE: Contiguous axial images were obtained from the base of the skull through the vertex without intravenous contrast. RADIATION DOSE REDUCTION: This exam was performed  according to the departmental dose-optimization program which includes automated exposure control, adjustment of the mA and/or kV according to patient size and/or use of iterative reconstruction technique. COMPARISON:  None Available. FINDINGS: Brain: There is mild to moderate severity cerebral atrophy with widening of the extra-axial spaces and ventricular dilatation. There are areas of decreased attenuation within the white matter tracts of the supratentorial brain, consistent with microvascular disease changes. Vascular: No hyperdense vessel or unexpected calcification. Skull: Normal. Negative for fracture or focal lesion. Sinuses/Orbits: No acute finding. Other: None. IMPRESSION: 1. No acute intracranial abnormality. 2. Cerebral atrophy and microvascular disease changes of the supratentorial brain. Electronically Signed   By: Aram Candela M.D.   On: 07/30/2022 23:04   DG Chest 2 View  Result Date: 07/30/2022 CLINICAL DATA:  Fatigue EXAM: CHEST - 2 VIEW COMPARISON:  Chest x-ray 09/15/2004 FINDINGS: The heart is mildly enlarged. There is no focal lung consolidation, pleural effusion or pneumothorax. There are degenerative changes throughout the spine. IMPRESSION: 1. No active cardiopulmonary disease. 2. Mild cardiomegaly. Electronically Signed   By: Darliss Cheney M.D.   On: 07/30/2022 22:51    Microbiology: Recent Results (from the past 240 hour(s))  Resp panel by RT-PCR (RSV, Flu A&B, Covid) Anterior Nasal Swab     Status: Abnormal   Collection Time: 07/30/22 10:22 PM   Specimen: Anterior Nasal Swab  Result Value Ref Range Status   SARS Coronavirus 2 by RT PCR POSITIVE (A) NEGATIVE Final    Comment: (NOTE) SARS-CoV-2 target nucleic acids are DETECTED.  The SARS-CoV-2 RNA is generally detectable in upper respiratory specimens during the acute phase of infection. Positive results are indicative of the presence of the identified virus, but do not rule out bacterial infection or co-infection  with other pathogens not detected by the test. Clinical correlation with patient history and other diagnostic information is necessary to determine patient infection status. The expected result is Negative.  Fact Sheet for Patients: BloggerCourse.com  Fact Sheet for Healthcare Providers: SeriousBroker.it  This test is not yet approved or cleared by the [place] FDA and  has been authorized for detection and/or diagnosis of SARS-CoV-2 by FDA under an Emergency Use Authorization (EUA).  This EUA will remain in effect (meaning this test can be used) for the duration of  the COVID-19 declaration under Section 564(b)(1) of the A ct, 21 U.S.C. section 360bbb-3(b)(1), unless the authorization is terminated or revoked sooner.     Influenza A by PCR NEGATIVE NEGATIVE Final   Influenza B by PCR NEGATIVE NEGATIVE Final    Comment: (NOTE) The Xpert Xpress SARS-CoV-2/FLU/RSV plus assay is intended as an aid in the diagnosis of influenza from Nasopharyngeal swab specimens and should not be used as a sole basis for treatment. Nasal washings and aspirates are unacceptable for Xpert Xpress SARS-CoV-2/FLU/RSV testing.  Fact Sheet for Patients: EntrepreneurPulse.com.au  Fact Sheet for Healthcare Providers: IncredibleEmployment.be  This test is not yet approved or cleared by the [place] FDA and has been authorized for detection and/or diagnosis of SARS-CoV-2 by FDA under an Emergency Use Authorization (EUA). This EUA will remain in effect (meaning this test can be used) for the duration of the COVID-19 declaration under Section 564(b)(1) of the Act, 21 U.S.C. section 360bbb-3(b)(1), unless the authorization is terminated or revoked.     Resp Syncytial Virus by PCR NEGATIVE NEGATIVE Final    Comment: (NOTE) Fact Sheet for Patients: EntrepreneurPulse.com.au  Fact Sheet for  Healthcare Providers: IncredibleEmployment.be  This test is not yet approved or cleared by the [place] FDA and has been authorized for detection and/or diagnosis of SARS-CoV-2 by FDA under an Emergency Use Authorization (EUA). This EUA will remain in effect (meaning this test can be used) for the duration of the COVID-19 declaration under Section 564(b)(1) of the Act, 21 U.S.C. section 360bbb-3(b)(1), unless the authorization is terminated or revoked.  Performed at [facility], [place] [address], [place], [place] [address]      Labs: Basic Metabolic Panel: Recent Labs  Lab 07/30/22 2230 07/31/22 0831 08/01/22 0312 08/02/22 0239  NA 133*  --  138 139  K 3.3*  --  3.5 3.6  CL 102  --  111 112*  CO2 21*  --  19* 20*  GLUCOSE 186*  --  124* 123*  BUN 23  --  18 22  CREATININE 1.00  --  0.83 0.95  CALCIUM 8.6*  --  8.0* 7.9*  MG  --  1.5* 2.0  --   PHOS  --  3.0 3.1  --    Liver Function Tests: Recent Labs  Lab 07/30/22 2230 08/01/22 0312 08/02/22 0239  AST 51* 47* 42*  ALT 19 22 22   ALKPHOS 43 36* 38  BILITOT 0.5 0.3 0.4  PROT 6.4* 5.8* 5.6*  ALBUMIN 3.0* 2.5* 2.5*   Recent Labs  Lab 07/30/22 2230  LIPASE 46   No results for input(s): "AMMONIA" in the last 168 hours. CBC: Recent Labs  Lab 07/30/22 2230 08/01/22 0312 08/02/22 0239  WBC 4.8 5.5 6.7  NEUTROABS  --  3.6 4.3  HGB 12.2 11.7* 12.0  HCT 36.4 35.6* 35.2*  MCV 92.6 93.4 91.9  PLT 135* 127* 135*   Cardiac Enzymes: No results for input(s): "CKTOTAL", "CKMB", "CKMBINDEX", "TROPONINI" in the last 168 hours. BNP: BNP (last 3 results) Recent Labs    07/31/22 0831  BNP 509.1*    ProBNP (last 3 results) No results for input(s): "PROBNP" in the last 8760 hours.  CBG: No results for input(s): "GLUCAP" in the last 168 hours.     Signed:  Desma Maxim MD.  [facility] 08/02/2022, 8:57 AM

## 2022-08-02 NOTE — TOC Transition Note (Signed)
Transition of Care [facility]) - CM/SW Discharge Note   Patient Details  Name: Cosima Prentiss MRN: [id number] Date of Birth: Nov 17, 1924  Transition of Care [facility]) CM/SW Contact:  Levonne Lapping, RN Phone Number: 08/02/2022, 10:17 AM   Clinical Narrative:    Patient to DC to Home today with Home Health.  [facility] will provide services. Patient Choice.  A rolling walker and a bedside commode to be delivered bedside by [employer]- No patient preference Niece, Collie Siad states that there are several family members that will coordinate staying with patient at home for the next couple of weeks. Niece states that patient is extremely independent and probably will not need assistance very long. Niece will be transporting patient to home . No additional TOC needs      Final next level of care: Home w Home Health Services Barriers to Discharge: No Barriers Identified   Patient Goals and CMS Choice CMS Medicare.gov Compare Post Acute Care list provided to:: Patient Choice offered to / list presented to : Patient  Discharge Placement               Home with [place]           Discharge Plan and Services Additional resources added to the After Visit Summary for                  DME Arranged: Walker rolling, Bedside commode DME Agency: [facility] Date DME Agency Contacted: 08/02/22 Time DME Agency Contacted: 0930 Representative spoke with at DME Agency: [place]: [facility] Date [place]: 08/02/22 Time [place]: 1005 Representative spoke with at [place]: [place] Determinants of Health ([place]) Interventions SDOH Screenings   Tobacco Use: Unknown (07/30/2022)     Readmission Risk Interventions     No data to display

## 2022-08-02 NOTE — Progress Notes (Signed)
Physical Therapy Treatment Patient Details Name: Kari Garcia MRN: [id number] DOB: Jan 02, 1925 Today's Date: 08/02/2022   History of Present Illness Kari Garcia is a 89 y.o. female presenting with weakness after having multiple falls.  At baseline patient ambulates normally without assistance and carries out her ADLs without need of assistance.  In the last couple days leadingup to this admission she reports having several falls with complaints of weakness. She is able to get herself back up reported subsequently again. Approximately 2 weeks prior to admission, she started developing nasal congestion and sinus pressure with complaints of ear discomfort.  She reports having a productive cough, but he is unable to cough anything up.  Patient notes that over the last couple days she has started to feel unwell.  She complains of having subjective fever/chills, sleeping more than usual, nausea with decreased p.o. intake, and felt like head congestion had moved into her chest. tested positive for covid; with medical history significant of hypothyroidism    PT Comments    Patient progressing well towards PT goals. Session focused on progressive ambulation and functional mobility. Requires Min guard assist for transfers and Min guard-Min A for gait training with use of RW for support. Pt needs cues for RW safety and management as she is not used to using one at home. Tolerated functional sit to stand exercise for strengthening. VSS on RA. Reports feeling not at her baseline. Will need supervision at home for all mobility initially for the first week or so and HHPT. Will follow acutely.    Recommendations for follow up therapy are one component of a multi-disciplinary discharge planning process, led by the attending physician.  Recommendations may be updated based on patient status, additional functional criteria and insurance authorization.  Follow Up Recommendations  Home health PT     Assistance  Recommended at Discharge Frequent or constant Supervision/Assistance  Patient can return home with the following A little help with walking and/or transfers;Assistance with cooking/housework;Help with stairs or ramp for entrance;Assist for transportation   Equipment Recommendations  Rolling walker (2 wheels);BSC/3in1 (youth size RW)    Recommendations for Other Services       Precautions / Restrictions Precautions Precautions: Fall Precaution Comments: air/con Covid Restrictions Weight Bearing Restrictions: No     Mobility  Bed Mobility               General bed mobility comments: Up in chair upon PT arrival.    Transfers Overall transfer level: Needs assistance Equipment used: Rolling walker (2 wheels) Transfers: Sit to/from Stand Sit to Stand: Min guard, Supervision           General transfer comment: Stood from chair x11 with cues for hand placement/technique.    Ambulation/Gait Ambulation/Gait assistance: Min guard Gait Distance (Feet): 150 Feet Assistive device: Rolling walker (2 wheels) Gait Pattern/deviations: Step-through pattern, Decreased step length - right, Decreased step length - left, Drifts right/left Gait velocity: decreased Gait velocity interpretation: <1.8 ft/sec, indicate of risk for recurrent falls   General Gait Details: Slow, mildly unsteady gait with use of RW for support, drifting in both directions and needs cues for RW management. Does not use at home. VSS on RA>   Stairs             Wheelchair Mobility    Modified Rankin (Stroke Patients Only)       Balance Overall balance assessment: Needs assistance Sitting-balance support: Feet supported, No upper extremity supported Sitting balance-Leahy Scale: Good  Standing balance support: During functional activity, Single extremity supported Standing balance-Leahy Scale: Poor Standing balance comment: Requires at least 1 UE support                             Cognition Arousal/Alertness: Awake/alert Behavior During Therapy: WFL for tasks assessed/performed Overall Cognitive Status: Impaired/Different from baseline Area of Impairment: Safety/judgement                         Safety/Judgement: Decreased awareness of safety, Decreased awareness of deficits     General Comments: poor awareness of current deficits and how they will impact mobility at home, likely baseline,. "i cannot sit still."        Exercises Other Exercises Other Exercises: Sit to stand x10 from chair for functional strengthening    General Comments General comments (skin integrity, edema, etc.): Niece, Collie Siad, present during session.      Pertinent Vitals/Pain Pain Assessment Pain Assessment: No/denies pain    Home Living Family/patient expects to be discharged to:: Private residence Living Arrangements: Alone;Other relatives Available Help at Discharge: Family;Available PRN/intermittently Type of Home: House Home Access: Stairs to enter   Entrance Stairs-Number of Steps: 2   Home Layout: One level        Prior Function            PT Goals (current goals can now be found in the care plan section) Progress towards PT goals: Progressing toward goals    Frequency    Min 4X/week      PT Plan Current plan remains appropriate    Co-evaluation              AM-PAC PT "6 Clicks" Mobility   Outcome Measure  Help needed turning from your back to your side while in a flat bed without using bedrails?: None Help needed moving from lying on your back to sitting on the side of a flat bed without using bedrails?: A Little Help needed moving to and from a bed to a chair (including a wheelchair)?: A Little Help needed standing up from a chair using your arms (e.g., wheelchair or bedside chair)?: A Little Help needed to walk in hospital room?: A Little Help needed climbing 3-5 steps with a railing? : A Little 6 Click Score: 19    End of  Session Equipment Utilized During Treatment: Gait belt Activity Tolerance: Patient tolerated treatment well Patient left: in chair;with call bell/phone within reach;with chair alarm set;with family/visitor present Nurse Communication: Mobility status PT Visit Diagnosis: Unsteadiness on feet (R26.81);Muscle weakness (generalized) (M62.81);Repeated falls (R29.6)     Time: 9211-9417 PT Time Calculation (min) (ACUTE ONLY): 16 min  Charges:  $Gait Training: 8-22 mins                     Marisa Severin, PT, DPT Acute Rehabilitation Services Secure chat preferred Office [place] 08/02/2022, 9:21 AM

## 2022-08-02 NOTE — Evaluation (Signed)
Occupational Therapy Evaluation Patient Details Name: Kari Garcia MRN: [id number] DOB: 1925/02/24 Today's Date: 08/02/2022   History of Present Illness Kari Garcia is a 89 y.o. female presenting with weakness after having multiple falls.  At baseline patient ambulates normally without assistance and carries out her ADLs without need of assistance.  In the last couple days leadingup to this admission she reports having several falls with complaints of weakness. She is able to get herself back up reported subsequently again. Approximately 2 weeks prior to admission, she started developing nasal congestion and sinus pressure with complaints of ear discomfort.  She reports having a productive cough, but he is unable to cough anything up.  Patient notes that over the last couple days she has started to feel unwell.  She complains of having subjective fever/chills, sleeping more than usual, nausea with decreased p.o. intake, and felt like head congestion had moved into her chest. tested positive for covid; with medical history significant of hypothyroidism   Clinical Impression   Patient admitted for the diagnosis above.  PTA she lives alone, and remains independent with ADL, iADL, mobility and driving.  Patient is very unsteady upon eval, and demonstrates decreased insight into her deficits.  She has had several falls at home, and is very close to needing SNF level rehab to regain balance and safety.  Patient's family is able to provide increased assist at home for a short time, thus home with family assist and [facility] rehab is a possibility.  OT will follow if she remains in the acute setting to address deficits and maximize her functions.        Recommendations for follow up therapy are one component of a multi-disciplinary discharge planning process, led by the attending physician.  Recommendations may be updated based on patient status, additional functional criteria and insurance authorization.    Follow Up Recommendations  Home health OT vs SNF for short term rehab    Assistance Recommended at Discharge Frequent or constant Supervision/Assistance  Patient can return home with the following Assist for transportation;Assistance with cooking/housework;Help with stairs or ramp for entrance    Functional Status Assessment     Equipment Recommendations  BSC/3in1;Tub/shower seat    Recommendations for Other Services       Precautions / Restrictions Precautions Precautions: Fall Precaution Comments: air/con Covid Restrictions Weight Bearing Restrictions: No      Mobility Bed Mobility Overal bed mobility: Needs Assistance Bed Mobility: Supine to Sit     Supine to sit: Modified independent (Device/Increase time)          Transfers Overall transfer level: Needs assistance Equipment used: 1 person hand held assist Transfers: Sit to/from Stand, Bed to chair/wheelchair/BSC Sit to Stand: Min guard     Step pivot transfers: Min assist     General transfer comment: very unsteady      Balance Overall balance assessment: Needs assistance Sitting-balance support: Feet supported Sitting balance-Leahy Scale: Good     Standing balance support: No upper extremity supported Standing balance-Leahy Scale: Poor                             ADL either performed or assessed with clinical judgement   ADL       Grooming: Min guard;Standing               Lower Body Dressing: Min guard;Sit to/from stand   Toilet Transfer: Minimal Holiday representative;Ambulation  Vision Baseline Vision/History: 1 Wears glasses Patient Visual Report: No change from baseline       Perception     Praxis      Pertinent Vitals/Pain Pain Assessment Pain Assessment: No/denies pain     Hand Dominance Right   Extremity/Trunk Assessment Upper Extremity Assessment Upper Extremity Assessment: Overall WFL for tasks assessed   Lower  Extremity Assessment Lower Extremity Assessment: Defer to PT evaluation   Cervical / Trunk Assessment Cervical / Trunk Assessment: Kyphotic   Communication Communication Communication: No difficulties   Cognition Arousal/Alertness: Awake/alert Behavior During Therapy: WFL for tasks assessed/performed Overall Cognitive Status: Within Functional Limits for tasks assessed Area of Impairment: Safety/judgement                         Safety/Judgement: Decreased awareness of safety, Decreased awareness of deficits     General Comments: probably baseline.  talking about scrubibng floors tomorrow when she gets home     General Comments   VSS on RA    Exercises     Shoulder Instructions      Home Living Family/patient expects to be discharged to:: Private residence Living Arrangements: Alone;Other relatives Available Help at Discharge: Family;Available PRN/intermittently Type of Home: House Home Access: Stairs to enter [employer] of Steps: 2   Home Layout: One level     Bathroom Shower/Tub: Tub/shower unit;Walk-in shower   Bathroom Toilet: Standard Bathroom Accessibility: Yes How Accessible: Accessible via walker            Prior Functioning/Environment Prior Level of Function : Independent/Modified Independent             Mobility Comments: no assistive device, no difficulty, still drives ADLs Comments: independent ADL and iADL        OT Problem List: Decreased safety awareness;Impaired balance (sitting and/or standing)      OT Treatment/Interventions: Self-care/ADL training;Therapeutic activities;Patient/family education;Balance training    OT Goals(Current goals can be found in the care plan section) Acute Rehab OT Goals Patient Stated Goal: Return home OT Goal Formulation: With patient Time For Goal Achievement: 08/16/22 Potential to Achieve Goals: Good ADL Goals Pt Will Perform Grooming: with modified  independence;standing Pt Will Perform Lower Body Dressing: with modified independence;sit to/from stand Pt Will Transfer to Toilet: with modified independence;ambulating;regular height toilet  OT Frequency: Min 2X/week    Co-evaluation              AM-PAC OT "6 Clicks" Daily Activity     Outcome Measure Help from another person eating meals?: None Help from another person taking care of personal grooming?: A Little Help from another person toileting, which includes using toliet, bedpan, or urinal?: A Little Help from another person bathing (including washing, rinsing, drying)?: A Little Help from another person to put on and taking off regular upper body clothing?: None Help from another person to put on and taking off regular lower body clothing?: A Little 6 Click Score: 20   End of Session Nurse Communication: Mobility status  Activity Tolerance: Patient tolerated treatment well Patient left: in chair;with call bell/phone within reach;with chair alarm set;with family/visitor present                   Time: 1610-9604 OT Time Calculation (min): 28 min Charges:  OT General Charges $OT Visit: 1 Visit OT Evaluation $OT Eval Moderate Complexity: 1 Mod OT Treatments $Self Care/Home Management : 8-22 mins  08/02/2022  RP, OTR/L  Acute Rehabilitation  Services  Office:  [phone]   Metta Clines 08/02/2022, 8:57 AM

## 2022-08-02 NOTE — Progress Notes (Signed)
Awaiting for pt's DME to be delivered to bedside before pt can be discharged home.

## 2022-08-10 DIAGNOSIS — I700 Atherosclerosis of aorta: Secondary | ICD-10-CM | POA: Diagnosis not present

## 2022-08-10 DIAGNOSIS — H9190 Unspecified hearing loss, unspecified ear: Secondary | ICD-10-CM | POA: Diagnosis not present

## 2022-08-10 DIAGNOSIS — E876 Hypokalemia: Secondary | ICD-10-CM | POA: Diagnosis not present

## 2022-08-10 DIAGNOSIS — I2510 Atherosclerotic heart disease of native coronary artery without angina pectoris: Secondary | ICD-10-CM | POA: Diagnosis not present

## 2022-08-10 DIAGNOSIS — R030 Elevated blood-pressure reading, without diagnosis of hypertension: Secondary | ICD-10-CM | POA: Diagnosis not present

## 2022-08-10 DIAGNOSIS — E039 Hypothyroidism, unspecified: Secondary | ICD-10-CM | POA: Diagnosis not present

## 2022-08-10 DIAGNOSIS — M4855XD Collapsed vertebra, not elsewhere classified, thoracolumbar region, subsequent encounter for fracture with routine healing: Secondary | ICD-10-CM | POA: Diagnosis not present

## 2022-08-10 DIAGNOSIS — Z9181 History of falling: Secondary | ICD-10-CM | POA: Diagnosis not present

## 2022-08-10 DIAGNOSIS — U071 COVID-19: Secondary | ICD-10-CM | POA: Diagnosis not present

## 2022-08-11 DIAGNOSIS — U071 COVID-19: Secondary | ICD-10-CM | POA: Diagnosis not present

## 2022-08-12 DIAGNOSIS — Z9181 History of falling: Secondary | ICD-10-CM | POA: Diagnosis not present

## 2022-08-12 DIAGNOSIS — U071 COVID-19: Secondary | ICD-10-CM | POA: Diagnosis not present

## 2022-08-12 DIAGNOSIS — H9190 Unspecified hearing loss, unspecified ear: Secondary | ICD-10-CM | POA: Diagnosis not present

## 2022-08-12 DIAGNOSIS — E876 Hypokalemia: Secondary | ICD-10-CM | POA: Diagnosis not present

## 2022-08-12 DIAGNOSIS — R030 Elevated blood-pressure reading, without diagnosis of hypertension: Secondary | ICD-10-CM | POA: Diagnosis not present

## 2022-08-12 DIAGNOSIS — M4855XD Collapsed vertebra, not elsewhere classified, thoracolumbar region, subsequent encounter for fracture with routine healing: Secondary | ICD-10-CM | POA: Diagnosis not present

## 2022-08-12 DIAGNOSIS — E039 Hypothyroidism, unspecified: Secondary | ICD-10-CM | POA: Diagnosis not present

## 2022-08-12 DIAGNOSIS — I700 Atherosclerosis of aorta: Secondary | ICD-10-CM | POA: Diagnosis not present

## 2022-08-12 DIAGNOSIS — I2510 Atherosclerotic heart disease of native coronary artery without angina pectoris: Secondary | ICD-10-CM | POA: Diagnosis not present

## 2022-08-13 DIAGNOSIS — M4855XD Collapsed vertebra, not elsewhere classified, thoracolumbar region, subsequent encounter for fracture with routine healing: Secondary | ICD-10-CM | POA: Diagnosis not present

## 2022-08-13 DIAGNOSIS — I2510 Atherosclerotic heart disease of native coronary artery without angina pectoris: Secondary | ICD-10-CM | POA: Diagnosis not present

## 2022-08-13 DIAGNOSIS — E039 Hypothyroidism, unspecified: Secondary | ICD-10-CM | POA: Diagnosis not present

## 2022-08-13 DIAGNOSIS — I700 Atherosclerosis of aorta: Secondary | ICD-10-CM | POA: Diagnosis not present

## 2022-08-13 DIAGNOSIS — R030 Elevated blood-pressure reading, without diagnosis of hypertension: Secondary | ICD-10-CM | POA: Diagnosis not present

## 2022-08-13 DIAGNOSIS — E876 Hypokalemia: Secondary | ICD-10-CM | POA: Diagnosis not present

## 2022-08-13 DIAGNOSIS — H9190 Unspecified hearing loss, unspecified ear: Secondary | ICD-10-CM | POA: Diagnosis not present

## 2022-08-13 DIAGNOSIS — Z9181 History of falling: Secondary | ICD-10-CM | POA: Diagnosis not present

## 2022-08-13 DIAGNOSIS — U071 COVID-19: Secondary | ICD-10-CM | POA: Diagnosis not present

## 2022-08-16 DIAGNOSIS — H9190 Unspecified hearing loss, unspecified ear: Secondary | ICD-10-CM | POA: Diagnosis not present

## 2022-08-16 DIAGNOSIS — U071 COVID-19: Secondary | ICD-10-CM | POA: Diagnosis not present

## 2022-08-16 DIAGNOSIS — R030 Elevated blood-pressure reading, without diagnosis of hypertension: Secondary | ICD-10-CM | POA: Diagnosis not present

## 2022-08-16 DIAGNOSIS — I2510 Atherosclerotic heart disease of native coronary artery without angina pectoris: Secondary | ICD-10-CM | POA: Diagnosis not present

## 2022-08-16 DIAGNOSIS — E039 Hypothyroidism, unspecified: Secondary | ICD-10-CM | POA: Diagnosis not present

## 2022-08-16 DIAGNOSIS — E876 Hypokalemia: Secondary | ICD-10-CM | POA: Diagnosis not present

## 2022-08-16 DIAGNOSIS — I700 Atherosclerosis of aorta: Secondary | ICD-10-CM | POA: Diagnosis not present

## 2022-08-16 DIAGNOSIS — M4855XD Collapsed vertebra, not elsewhere classified, thoracolumbar region, subsequent encounter for fracture with routine healing: Secondary | ICD-10-CM | POA: Diagnosis not present

## 2022-08-16 DIAGNOSIS — Z9181 History of falling: Secondary | ICD-10-CM | POA: Diagnosis not present

## 2022-08-17 DIAGNOSIS — H9190 Unspecified hearing loss, unspecified ear: Secondary | ICD-10-CM | POA: Diagnosis not present

## 2022-08-17 DIAGNOSIS — R030 Elevated blood-pressure reading, without diagnosis of hypertension: Secondary | ICD-10-CM | POA: Diagnosis not present

## 2022-08-17 DIAGNOSIS — I700 Atherosclerosis of aorta: Secondary | ICD-10-CM | POA: Diagnosis not present

## 2022-08-17 DIAGNOSIS — E039 Hypothyroidism, unspecified: Secondary | ICD-10-CM | POA: Diagnosis not present

## 2022-08-17 DIAGNOSIS — I2510 Atherosclerotic heart disease of native coronary artery without angina pectoris: Secondary | ICD-10-CM | POA: Diagnosis not present

## 2022-08-17 DIAGNOSIS — U071 COVID-19: Secondary | ICD-10-CM | POA: Diagnosis not present

## 2022-08-17 DIAGNOSIS — Z9181 History of falling: Secondary | ICD-10-CM | POA: Diagnosis not present

## 2022-08-17 DIAGNOSIS — M4855XD Collapsed vertebra, not elsewhere classified, thoracolumbar region, subsequent encounter for fracture with routine healing: Secondary | ICD-10-CM | POA: Diagnosis not present

## 2022-08-17 DIAGNOSIS — E876 Hypokalemia: Secondary | ICD-10-CM | POA: Diagnosis not present

## 2022-08-19 DIAGNOSIS — E876 Hypokalemia: Secondary | ICD-10-CM | POA: Diagnosis not present

## 2022-08-19 DIAGNOSIS — U071 COVID-19: Secondary | ICD-10-CM | POA: Diagnosis not present

## 2022-08-19 DIAGNOSIS — E039 Hypothyroidism, unspecified: Secondary | ICD-10-CM | POA: Diagnosis not present

## 2022-08-19 DIAGNOSIS — I700 Atherosclerosis of aorta: Secondary | ICD-10-CM | POA: Diagnosis not present

## 2022-08-19 DIAGNOSIS — H9190 Unspecified hearing loss, unspecified ear: Secondary | ICD-10-CM | POA: Diagnosis not present

## 2022-08-19 DIAGNOSIS — Z9181 History of falling: Secondary | ICD-10-CM | POA: Diagnosis not present

## 2022-08-19 DIAGNOSIS — I2510 Atherosclerotic heart disease of native coronary artery without angina pectoris: Secondary | ICD-10-CM | POA: Diagnosis not present

## 2022-08-19 DIAGNOSIS — M4855XD Collapsed vertebra, not elsewhere classified, thoracolumbar region, subsequent encounter for fracture with routine healing: Secondary | ICD-10-CM | POA: Diagnosis not present

## 2022-08-19 DIAGNOSIS — R030 Elevated blood-pressure reading, without diagnosis of hypertension: Secondary | ICD-10-CM | POA: Diagnosis not present

## 2022-08-23 DIAGNOSIS — I700 Atherosclerosis of aorta: Secondary | ICD-10-CM | POA: Diagnosis not present

## 2022-08-23 DIAGNOSIS — H9190 Unspecified hearing loss, unspecified ear: Secondary | ICD-10-CM | POA: Diagnosis not present

## 2022-08-23 DIAGNOSIS — U071 COVID-19: Secondary | ICD-10-CM | POA: Diagnosis not present

## 2022-08-23 DIAGNOSIS — E876 Hypokalemia: Secondary | ICD-10-CM | POA: Diagnosis not present

## 2022-08-23 DIAGNOSIS — R030 Elevated blood-pressure reading, without diagnosis of hypertension: Secondary | ICD-10-CM | POA: Diagnosis not present

## 2022-08-23 DIAGNOSIS — Z9181 History of falling: Secondary | ICD-10-CM | POA: Diagnosis not present

## 2022-08-23 DIAGNOSIS — I2510 Atherosclerotic heart disease of native coronary artery without angina pectoris: Secondary | ICD-10-CM | POA: Diagnosis not present

## 2022-08-23 DIAGNOSIS — M4855XD Collapsed vertebra, not elsewhere classified, thoracolumbar region, subsequent encounter for fracture with routine healing: Secondary | ICD-10-CM | POA: Diagnosis not present

## 2022-08-23 DIAGNOSIS — E039 Hypothyroidism, unspecified: Secondary | ICD-10-CM | POA: Diagnosis not present

## 2022-08-24 NOTE — Progress Notes (Unsigned)
Cardiology Office Note:    Date:  08/26/2022   ID:  Kari Garcia, DOB 1925-03-21, MRN [id number]  PCP:  Haywood Pao, MD   [facility] Providers Cardiologist:  Donato Heinz, MD {   Referring MD: Haywood Pao, MD   Chief Complaint  Patient presents with   Hospitalization Follow-up    History of Present Illness:    Kari Garcia is a 89 y.o. female with a hx of hypothyroidism, recent COVID infection. Patient was recently admitted to the hospital from 07/30/22-08/02/22 for treatment of COVID-19 infection. Patient noted to have acute hypoxic respiratory failure and elevated BP to 193/83 on admission. Her hsTn was elevated to 579>368. Patient was evaluated by Dr. Gardiner Rhyme for elevated troponins. Echocardiogram on 08/01/22 showed EF 60-65% with no regional wall motion abnormalities, mild LVH, normal RV systolic function.  It was suspected that troponin elevation was demand ischemia in the setting of markedly elevated BP on presentation and active COVID-19 infection. No further cardiac workup was pursed. Patient was treated with steroids for COVID, and it was thought that her BP was likely elevated due to steroid treatments.   Today, patient is accompanied by her niece. Patient reports that she has been doing very well since being discharged from the hospital. She denies chest pain, palpitations, shortness of breath, orthopnea, ankle edema. Her niece reports that before patient went to the hospital, she had not eaten or drank water for about 3 days. She was very dehydrated and fell about 3 times in her driveway. Since coming home from the hospital, patient denies any recurrent falls. She works every day with PT and OT. Denies dizziness, lightheadedness, syncope, near syncope. No symptoms of orthostatic hypotension. When PT/OT take her blood pressure, it has been well controlled. Once, her BP was low at 97/50s   Past Medical History:  Diagnosis Date   Goiter     Hypothyroidism    Macular degeneration    Osteoporosis    Vitamin D deficiency     Past Surgical History:  Procedure Laterality Date   APPENDECTOMY     BUNIONECTOMY Right    COLONOSCOPY     benign polyp removed   teeth implant Right    TONSILLECTOMY      Current Medications: Current Meds  Medication Sig   acetaminophen (TYLENOL) 500 MG tablet Take 500 mg by mouth every 6 (six) hours as needed for moderate pain.   alendronate (FOSAMAX) 70 MG tablet Take 70 mg by mouth once a week. Saturday   dorzolamide (TRUSOPT) 2 % ophthalmic solution Place 1 drop into both eyes 2 (two) times daily.   levothyroxine (SYNTHROID) 25 MCG tablet Take 25 mcg by mouth daily before breakfast.   timolol (BETIMOL) 0.5 % ophthalmic solution 1 drop 2 (two) times daily.   Vitamin D, Ergocalciferol, (DRISDOL) 50000 UNITS CAPS capsule Take 50,000 Units by mouth every 7 (seven) days. Tuesday     Allergies:   Codeine   Social History   Socioeconomic History   Marital status: Widowed    Spouse name: Not on file   Number of children: Not on file   Years of education: Not on file   Highest education level: Not on file  Occupational History   Not on file  Tobacco Use   Smoking status: Unknown   Smokeless tobacco: Never  Vaping Use   Vaping Use: Never used  Substance and Sexual Activity   Alcohol use: Never   Drug use: Never   Sexual  activity: Not on file  Other Topics Concern   Not on file  Social History Narrative   Not on file   Social Determinants of Health   Financial Resource Strain: Not on file  Food Insecurity: Not on file  Transportation Needs: Not on file  Physical Activity: Not on file  Stress: Not on file  Social Connections: Not on file     Family History: The patient's family history is not on file.  ROS:   Please see the history of present illness.     All other systems reviewed and are negative.  EKGs/Labs/Other Studies Reviewed:    The following studies were  reviewed today: Echocardiogram 08/01/22 1. Left ventricular ejection fraction, by estimation, is 60 to 65%. The  left ventricle has normal function. The left ventricle has no regional  wall motion abnormalities. There is mild left ventricular hypertrophy.  Left ventricular diastolic parameters  are indeterminate.   2. Right ventricular systolic function is normal. The right ventricular  size is normal. Tricuspid regurgitation signal is inadequate for assessing  PA pressure.   3. The mitral valve is normal in structure. Mild mitral valve  regurgitation. No evidence of mitral stenosis.   4. The aortic valve is tricuspid. Aortic valve regurgitation is not  visualized. Aortic valve sclerosis is present, with no evidence of aortic  valve stenosis.   5. The inferior vena cava is dilated in size with <50% respiratory  variability, suggesting right atrial pressure of 15 mmHg.    Recent Labs: 07/31/2022: B Natriuretic Peptide 509.1; TSH 1.006 08/01/2022: Magnesium 2.0 08/02/2022: ALT 22; BUN 22; Creatinine, Ser 0.95; Hemoglobin 12.0; Platelets 135; Potassium 3.6; Sodium 139  Recent Lipid Panel No results found for: "CHOL", "TRIG", "HDL", "CHOLHDL", "VLDL", "LDLCALC", "LDLDIRECT"   Risk Assessment/Calculations:                Physical Exam:    VS:  BP 132/70   Pulse 66   Ht 4' 11"$  (1.499 m)   Wt 106 lb 12.8 oz (48.4 kg)   SpO2 97%   BMI 21.57 kg/m     Wt Readings from Last 3 Encounters:  08/26/22 106 lb 12.8 oz (48.4 kg)  07/31/22 99 lb 3.3 oz (45 kg)  10/07/17 106 lb (48.1 kg)     GEN: Elderly female, sitting comfortably in the chair in no acute distress  HEENT: Normal NECK: No JVD when sitting upright  CARDIAC: Regular rate, irregular rhythm. No murmurs, rubs, gallops. Radial pulses 2+ bilaterally  RESPIRATORY:  Clear to auscultation without rales, wheezing or rhonchi. Normal WOB on room air  MUSCULOSKELETAL:  No edema in BLE; No deformity  SKIN: Warm and dry NEUROLOGIC:   Alert and oriented x 3 PSYCHIATRIC:  Normal affect   ASSESSMENT:    1. PAC (premature atrial contraction)   2. Elevated troponin   3. COVID-19 virus infection   4. Primary hypertension    PLAN:    In order of problems listed above:  Hospital Follow Up COVID, Trop elevation  - Patient was admitted to the hospital from 07/30/22-08/02/22 for treatment of COVID-19. Found to have hsTn elevated to 5779>368 - Echocardiogram showed EF 60-65%, no regional wall motion abnormalities - It was suspected that trop elevation was demand ischemia in the setting of dehydration, COVID infection, elevated BP (BP was 193/83 on arrival to ED) - Since being discharged from the hospital, patient denies chest pain, DOE, dizziness, near syncope/syncope. She is working with PT and OT almost every  day, and believes she has recovered well from her hospitalization. She has no complaints or concerns today. Family is very involved in patient's care and they consistently check in on the patient at home - Discussed troponin elevation, echo findings, and hospital course with patient and her niece. All questions answered   Hypertension  - BP was elevated to 193/83 when she presented to the ED on 1/19. At discharge, her BP remained elevated to 150/50  - Initial BP in the office today was elevated to 157/78. On my recheck, BP had improved to 132/70.  - Patient reports that her BP is usually well controlled at home and is often in the [id number] systolic. However, BP has also been low and was 97/50 with physical therapy a few days ago. Patient denies symptoms of hypotension  - Given patient's advanced age and intermittently low BP at home, I will not start BP medication at this time as to avoid hypotension/dizziness/falls   PACs - Patient had an irregular heart rhythm on exam - I obtained an EKG that showed normal sinus rhythm with PACs - Patient is asymptomatic  Medication Adjustments/Labs and Tests Ordered: Current medicines  are reviewed at length with the patient today.  Concerns regarding medicines are outlined above.  Orders Placed This Encounter  Procedures   EKG 12-Lead   No orders of the defined types were placed in this encounter.   Patient Instructions  Medication Instructions:   Your physician recommends that you continue on your current medications as directed. Please refer to the Current Medication list given to you today.  *If you need a refill on your cardiac medications before your next appointment, please call your pharmacy*  Lab Work: NONE ordered at this time of appointment   If you have labs (blood work) drawn today and your tests are completely normal, you will receive your results only by: [place] (if you have MyChart) OR A paper copy in the mail If you have any lab test that is abnormal or we need to change your treatment, we will call you to review the results.  Testing/Procedures: NONE ordered at this time of appointment   Follow-Up: At [facility], you and your health needs are our priority.  As part of our continuing mission to provide you with exceptional heart care, we have created designated Provider Care Teams.  These Care Teams include your primary Cardiologist (physician) and Advanced Practice Providers (APPs -  Physician Assistants and Nurse Practitioners) who all work together to provide you with the care you need, when you need it.  We recommend signing up for the patient portal called "MyChart".  Sign up information is provided on this After Visit Summary.  MyChart is used to connect with patients for Virtual Visits (Telemedicine).  Patients are able to view lab/test results, encounter notes, upcoming appointments, etc.  Non-urgent messages can be sent to your provider as well.   To learn more about what you can do with MyChart, go to [email or web address].    Your next appointment:   1 year(s)  Provider:   Donato Heinz, MD     Other  Instructions    Signed, Margie Billet, PA-C  08/26/2022 11:46 AM    [place]

## 2022-08-26 ENCOUNTER — Ambulatory Visit: Payer: Medicare Other | Attending: General Practice | Admitting: Cardiology

## 2022-08-26 ENCOUNTER — Encounter: Payer: Self-pay | Admitting: General Practice

## 2022-08-26 VITALS — BP 132/70 | HR 66 | Ht 59.0 in | Wt 106.8 lb

## 2022-08-26 DIAGNOSIS — U071 COVID-19: Secondary | ICD-10-CM

## 2022-08-26 DIAGNOSIS — R7989 Other specified abnormal findings of blood chemistry: Secondary | ICD-10-CM

## 2022-08-26 DIAGNOSIS — I10 Essential (primary) hypertension: Secondary | ICD-10-CM

## 2022-08-26 DIAGNOSIS — I491 Atrial premature depolarization: Secondary | ICD-10-CM

## 2022-08-26 NOTE — Patient Instructions (Signed)
Medication Instructions:   Your physician recommends that you continue on your current medications as directed. Please refer to the Current Medication list given to you today.  *If you need a refill on your cardiac medications before your next appointment, please call your pharmacy*  Lab Work: NONE ordered at this time of appointment   If you have labs (blood work) drawn today and your tests are completely normal, you will receive your results only by: [place] (if you have MyChart) OR A paper copy in the mail If you have any lab test that is abnormal or we need to change your treatment, we will call you to review the results.  Testing/Procedures: NONE ordered at this time of appointment   Follow-Up: At [facility], you and your health needs are our priority.  As part of our continuing mission to provide you with exceptional heart care, we have created designated Provider Care Teams.  These Care Teams include your primary Cardiologist (physician) and Advanced Practice Providers (APPs -  Physician Assistants and Nurse Practitioners) who all work together to provide you with the care you need, when you need it.  We recommend signing up for the patient portal called "MyChart".  Sign up information is provided on this After Visit Summary.  MyChart is used to connect with patients for Virtual Visits (Telemedicine).  Patients are able to view lab/test results, encounter notes, upcoming appointments, etc.  Non-urgent messages can be sent to your provider as well.   To learn more about what you can do with MyChart, go to [email or web address].    Your next appointment:   1 year(s)  Provider:   Donato Heinz, MD     Other Instructions

## 2022-08-27 DIAGNOSIS — I700 Atherosclerosis of aorta: Secondary | ICD-10-CM | POA: Diagnosis not present

## 2022-08-27 DIAGNOSIS — E039 Hypothyroidism, unspecified: Secondary | ICD-10-CM | POA: Diagnosis not present

## 2022-08-27 DIAGNOSIS — R030 Elevated blood-pressure reading, without diagnosis of hypertension: Secondary | ICD-10-CM | POA: Diagnosis not present

## 2022-08-27 DIAGNOSIS — E876 Hypokalemia: Secondary | ICD-10-CM | POA: Diagnosis not present

## 2022-08-27 DIAGNOSIS — M4855XD Collapsed vertebra, not elsewhere classified, thoracolumbar region, subsequent encounter for fracture with routine healing: Secondary | ICD-10-CM | POA: Diagnosis not present

## 2022-08-27 DIAGNOSIS — U071 COVID-19: Secondary | ICD-10-CM | POA: Diagnosis not present

## 2022-08-27 DIAGNOSIS — Z9181 History of falling: Secondary | ICD-10-CM | POA: Diagnosis not present

## 2022-08-27 DIAGNOSIS — I2510 Atherosclerotic heart disease of native coronary artery without angina pectoris: Secondary | ICD-10-CM | POA: Diagnosis not present

## 2022-08-27 DIAGNOSIS — H9190 Unspecified hearing loss, unspecified ear: Secondary | ICD-10-CM | POA: Diagnosis not present

## 2022-09-02 DIAGNOSIS — E039 Hypothyroidism, unspecified: Secondary | ICD-10-CM | POA: Diagnosis not present

## 2022-09-02 DIAGNOSIS — I700 Atherosclerosis of aorta: Secondary | ICD-10-CM | POA: Diagnosis not present

## 2022-09-02 DIAGNOSIS — U071 COVID-19: Secondary | ICD-10-CM | POA: Diagnosis not present

## 2022-09-02 DIAGNOSIS — M4855XD Collapsed vertebra, not elsewhere classified, thoracolumbar region, subsequent encounter for fracture with routine healing: Secondary | ICD-10-CM | POA: Diagnosis not present

## 2022-09-02 DIAGNOSIS — I2510 Atherosclerotic heart disease of native coronary artery without angina pectoris: Secondary | ICD-10-CM | POA: Diagnosis not present

## 2022-09-02 DIAGNOSIS — Z9181 History of falling: Secondary | ICD-10-CM | POA: Diagnosis not present

## 2022-09-02 DIAGNOSIS — E876 Hypokalemia: Secondary | ICD-10-CM | POA: Diagnosis not present

## 2022-09-02 DIAGNOSIS — R030 Elevated blood-pressure reading, without diagnosis of hypertension: Secondary | ICD-10-CM | POA: Diagnosis not present

## 2022-09-02 DIAGNOSIS — H9190 Unspecified hearing loss, unspecified ear: Secondary | ICD-10-CM | POA: Diagnosis not present

## 2022-09-08 DIAGNOSIS — E876 Hypokalemia: Secondary | ICD-10-CM | POA: Diagnosis not present

## 2022-09-08 DIAGNOSIS — I2510 Atherosclerotic heart disease of native coronary artery without angina pectoris: Secondary | ICD-10-CM | POA: Diagnosis not present

## 2022-09-08 DIAGNOSIS — I700 Atherosclerosis of aorta: Secondary | ICD-10-CM | POA: Diagnosis not present

## 2022-09-08 DIAGNOSIS — U071 COVID-19: Secondary | ICD-10-CM | POA: Diagnosis not present

## 2022-09-08 DIAGNOSIS — M4855XD Collapsed vertebra, not elsewhere classified, thoracolumbar region, subsequent encounter for fracture with routine healing: Secondary | ICD-10-CM | POA: Diagnosis not present

## 2022-09-08 DIAGNOSIS — H9190 Unspecified hearing loss, unspecified ear: Secondary | ICD-10-CM | POA: Diagnosis not present

## 2022-09-08 DIAGNOSIS — R030 Elevated blood-pressure reading, without diagnosis of hypertension: Secondary | ICD-10-CM | POA: Diagnosis not present

## 2022-09-08 DIAGNOSIS — E039 Hypothyroidism, unspecified: Secondary | ICD-10-CM | POA: Diagnosis not present

## 2022-09-08 DIAGNOSIS — Z9181 History of falling: Secondary | ICD-10-CM | POA: Diagnosis not present

## 2022-09-14 DIAGNOSIS — H9190 Unspecified hearing loss, unspecified ear: Secondary | ICD-10-CM | POA: Diagnosis not present

## 2022-09-14 DIAGNOSIS — I700 Atherosclerosis of aorta: Secondary | ICD-10-CM | POA: Diagnosis not present

## 2022-09-14 DIAGNOSIS — U071 COVID-19: Secondary | ICD-10-CM | POA: Diagnosis not present

## 2022-09-14 DIAGNOSIS — M4855XD Collapsed vertebra, not elsewhere classified, thoracolumbar region, subsequent encounter for fracture with routine healing: Secondary | ICD-10-CM | POA: Diagnosis not present

## 2022-09-14 DIAGNOSIS — Z9181 History of falling: Secondary | ICD-10-CM | POA: Diagnosis not present

## 2022-09-14 DIAGNOSIS — E039 Hypothyroidism, unspecified: Secondary | ICD-10-CM | POA: Diagnosis not present

## 2022-09-14 DIAGNOSIS — I2510 Atherosclerotic heart disease of native coronary artery without angina pectoris: Secondary | ICD-10-CM | POA: Diagnosis not present

## 2022-09-14 DIAGNOSIS — R030 Elevated blood-pressure reading, without diagnosis of hypertension: Secondary | ICD-10-CM | POA: Diagnosis not present

## 2022-09-14 DIAGNOSIS — E876 Hypokalemia: Secondary | ICD-10-CM | POA: Diagnosis not present

## 2022-09-15 DIAGNOSIS — H04123 Dry eye syndrome of bilateral lacrimal glands: Secondary | ICD-10-CM | POA: Diagnosis not present

## 2022-09-15 DIAGNOSIS — H401134 Primary open-angle glaucoma, bilateral, indeterminate stage: Secondary | ICD-10-CM | POA: Diagnosis not present

## 2022-09-15 DIAGNOSIS — H18593 Other hereditary corneal dystrophies, bilateral: Secondary | ICD-10-CM | POA: Diagnosis not present

## 2022-09-15 DIAGNOSIS — H02102 Unspecified ectropion of right lower eyelid: Secondary | ICD-10-CM | POA: Diagnosis not present

## 2022-09-28 DIAGNOSIS — H18593 Other hereditary corneal dystrophies, bilateral: Secondary | ICD-10-CM | POA: Diagnosis not present

## 2022-09-28 DIAGNOSIS — H04123 Dry eye syndrome of bilateral lacrimal glands: Secondary | ICD-10-CM | POA: Diagnosis not present

## 2022-12-09 DIAGNOSIS — K080 Exfoliation of teeth due to systemic causes: Secondary | ICD-10-CM | POA: Diagnosis not present

## 2022-12-20 DIAGNOSIS — K080 Exfoliation of teeth due to systemic causes: Secondary | ICD-10-CM | POA: Diagnosis not present

## 2023-01-03 DIAGNOSIS — R030 Elevated blood-pressure reading, without diagnosis of hypertension: Secondary | ICD-10-CM | POA: Diagnosis not present

## 2023-01-03 DIAGNOSIS — E039 Hypothyroidism, unspecified: Secondary | ICD-10-CM | POA: Diagnosis not present

## 2023-01-03 DIAGNOSIS — E559 Vitamin D deficiency, unspecified: Secondary | ICD-10-CM | POA: Diagnosis not present

## 2023-01-10 DIAGNOSIS — Z1339 Encounter for screening examination for other mental health and behavioral disorders: Secondary | ICD-10-CM | POA: Diagnosis not present

## 2023-01-10 DIAGNOSIS — E039 Hypothyroidism, unspecified: Secondary | ICD-10-CM | POA: Diagnosis not present

## 2023-01-10 DIAGNOSIS — Z0000 Encounter for general adult medical examination without abnormal findings: Secondary | ICD-10-CM | POA: Diagnosis not present

## 2023-01-10 DIAGNOSIS — Z1331 Encounter for screening for depression: Secondary | ICD-10-CM | POA: Diagnosis not present

## 2023-01-10 DIAGNOSIS — R82998 Other abnormal findings in urine: Secondary | ICD-10-CM | POA: Diagnosis not present

## 2023-01-31 DIAGNOSIS — H401134 Primary open-angle glaucoma, bilateral, indeterminate stage: Secondary | ICD-10-CM | POA: Diagnosis not present

## 2023-01-31 DIAGNOSIS — H18523 Epithelial (juvenile) corneal dystrophy, bilateral: Secondary | ICD-10-CM | POA: Diagnosis not present

## 2023-02-28 DIAGNOSIS — H18523 Epithelial (juvenile) corneal dystrophy, bilateral: Secondary | ICD-10-CM | POA: Diagnosis not present

## 2023-03-07 DIAGNOSIS — Z961 Presence of intraocular lens: Secondary | ICD-10-CM | POA: Diagnosis not present

## 2023-03-07 DIAGNOSIS — H18523 Epithelial (juvenile) corneal dystrophy, bilateral: Secondary | ICD-10-CM | POA: Diagnosis not present

## 2023-03-07 DIAGNOSIS — H401134 Primary open-angle glaucoma, bilateral, indeterminate stage: Secondary | ICD-10-CM | POA: Diagnosis not present

## 2023-03-07 DIAGNOSIS — Z9889 Other specified postprocedural states: Secondary | ICD-10-CM | POA: Diagnosis not present

## 2023-04-13 DIAGNOSIS — Z961 Presence of intraocular lens: Secondary | ICD-10-CM | POA: Diagnosis not present

## 2023-04-13 DIAGNOSIS — Z0100 Encounter for examination of eyes and vision without abnormal findings: Secondary | ICD-10-CM | POA: Diagnosis not present

## 2023-04-13 DIAGNOSIS — H18523 Epithelial (juvenile) corneal dystrophy, bilateral: Secondary | ICD-10-CM | POA: Diagnosis not present

## 2023-04-13 DIAGNOSIS — H401134 Primary open-angle glaucoma, bilateral, indeterminate stage: Secondary | ICD-10-CM | POA: Diagnosis not present

## 2023-04-13 DIAGNOSIS — Z9889 Other specified postprocedural states: Secondary | ICD-10-CM | POA: Diagnosis not present

## 2023-05-08 DIAGNOSIS — S52502A Unspecified fracture of the lower end of left radius, initial encounter for closed fracture: Secondary | ICD-10-CM | POA: Diagnosis not present

## 2023-05-08 DIAGNOSIS — M25532 Pain in left wrist: Secondary | ICD-10-CM | POA: Diagnosis not present

## 2023-05-09 DIAGNOSIS — S52502D Unspecified fracture of the lower end of left radius, subsequent encounter for closed fracture with routine healing: Secondary | ICD-10-CM | POA: Diagnosis not present

## 2023-05-23 DIAGNOSIS — H401134 Primary open-angle glaucoma, bilateral, indeterminate stage: Secondary | ICD-10-CM | POA: Diagnosis not present

## 2023-05-23 DIAGNOSIS — H02105 Unspecified ectropion of left lower eyelid: Secondary | ICD-10-CM | POA: Diagnosis not present

## 2023-05-23 DIAGNOSIS — H02102 Unspecified ectropion of right lower eyelid: Secondary | ICD-10-CM | POA: Diagnosis not present

## 2023-05-23 DIAGNOSIS — S52502D Unspecified fracture of the lower end of left radius, subsequent encounter for closed fracture with routine healing: Secondary | ICD-10-CM | POA: Diagnosis not present

## 2023-06-20 DIAGNOSIS — S52502D Unspecified fracture of the lower end of left radius, subsequent encounter for closed fracture with routine healing: Secondary | ICD-10-CM | POA: Diagnosis not present

## 2023-07-18 DIAGNOSIS — E039 Hypothyroidism, unspecified: Secondary | ICD-10-CM | POA: Diagnosis not present

## 2023-07-18 DIAGNOSIS — R7989 Other specified abnormal findings of blood chemistry: Secondary | ICD-10-CM | POA: Diagnosis not present

## 2023-07-20 DIAGNOSIS — Z9889 Other specified postprocedural states: Secondary | ICD-10-CM | POA: Diagnosis not present

## 2023-07-20 DIAGNOSIS — Z961 Presence of intraocular lens: Secondary | ICD-10-CM | POA: Diagnosis not present

## 2023-07-20 DIAGNOSIS — H401134 Primary open-angle glaucoma, bilateral, indeterminate stage: Secondary | ICD-10-CM | POA: Diagnosis not present

## 2023-07-20 DIAGNOSIS — H18522 Epithelial (juvenile) corneal dystrophy, left eye: Secondary | ICD-10-CM | POA: Diagnosis not present

## 2023-09-05 DIAGNOSIS — K080 Exfoliation of teeth due to systemic causes: Secondary | ICD-10-CM | POA: Diagnosis not present

## 2023-12-16 DIAGNOSIS — L97219 Non-pressure chronic ulcer of right calf with unspecified severity: Secondary | ICD-10-CM | POA: Diagnosis not present

## 2023-12-16 DIAGNOSIS — R600 Localized edema: Secondary | ICD-10-CM | POA: Diagnosis not present

## 2023-12-19 DIAGNOSIS — H401134 Primary open-angle glaucoma, bilateral, indeterminate stage: Secondary | ICD-10-CM | POA: Diagnosis not present

## 2023-12-19 DIAGNOSIS — Z961 Presence of intraocular lens: Secondary | ICD-10-CM | POA: Diagnosis not present

## 2023-12-19 DIAGNOSIS — Z9889 Other specified postprocedural states: Secondary | ICD-10-CM | POA: Diagnosis not present

## 2023-12-19 DIAGNOSIS — H18522 Epithelial (juvenile) corneal dystrophy, left eye: Secondary | ICD-10-CM | POA: Diagnosis not present

## 2023-12-21 DIAGNOSIS — M5432 Sciatica, left side: Secondary | ICD-10-CM | POA: Diagnosis not present

## 2023-12-21 DIAGNOSIS — L97219 Non-pressure chronic ulcer of right calf with unspecified severity: Secondary | ICD-10-CM | POA: Diagnosis not present

## 2023-12-23 DIAGNOSIS — Z9889 Other specified postprocedural states: Secondary | ICD-10-CM | POA: Diagnosis not present

## 2023-12-23 DIAGNOSIS — H18522 Epithelial (juvenile) corneal dystrophy, left eye: Secondary | ICD-10-CM | POA: Diagnosis not present

## 2023-12-28 DIAGNOSIS — I83012 Varicose veins of right lower extremity with ulcer of calf: Secondary | ICD-10-CM | POA: Diagnosis not present

## 2023-12-28 DIAGNOSIS — Z961 Presence of intraocular lens: Secondary | ICD-10-CM | POA: Diagnosis not present

## 2023-12-28 DIAGNOSIS — M5432 Sciatica, left side: Secondary | ICD-10-CM | POA: Diagnosis not present

## 2023-12-28 DIAGNOSIS — H401134 Primary open-angle glaucoma, bilateral, indeterminate stage: Secondary | ICD-10-CM | POA: Diagnosis not present

## 2023-12-28 DIAGNOSIS — Z9889 Other specified postprocedural states: Secondary | ICD-10-CM | POA: Diagnosis not present

## 2024-01-04 DIAGNOSIS — E039 Hypothyroidism, unspecified: Secondary | ICD-10-CM | POA: Diagnosis not present

## 2024-01-04 DIAGNOSIS — Z1389 Encounter for screening for other disorder: Secondary | ICD-10-CM | POA: Diagnosis not present

## 2024-01-04 DIAGNOSIS — I83012 Varicose veins of right lower extremity with ulcer of calf: Secondary | ICD-10-CM | POA: Diagnosis not present

## 2024-01-04 DIAGNOSIS — M5432 Sciatica, left side: Secondary | ICD-10-CM | POA: Diagnosis not present

## 2024-01-10 ENCOUNTER — Other Ambulatory Visit (HOSPITAL_COMMUNITY): Payer: Self-pay | Admitting: *Deleted

## 2024-01-10 DIAGNOSIS — D649 Anemia, unspecified: Secondary | ICD-10-CM

## 2024-01-11 DIAGNOSIS — R892 Abnormal level of other drugs, medicaments and biological substances in specimens from other organs, systems and tissues: Secondary | ICD-10-CM | POA: Diagnosis not present

## 2024-01-11 DIAGNOSIS — I83012 Varicose veins of right lower extremity with ulcer of calf: Secondary | ICD-10-CM | POA: Diagnosis not present

## 2024-01-12 ENCOUNTER — Encounter (HOSPITAL_COMMUNITY)
Admission: RE | Admit: 2024-01-12 | Discharge: 2024-01-12 | Disposition: A | Discharge status: Home / Self Care | Source: Ambulatory Visit | Attending: Internal Medicine | Admitting: Internal Medicine

## 2024-01-12 DIAGNOSIS — D649 Anemia, unspecified: Secondary | ICD-10-CM | POA: Diagnosis not present

## 2024-01-12 LAB — ABO/RH: ABO/RH(D): A POS

## 2024-01-12 LAB — PREPARE RBC (CROSSMATCH)

## 2024-01-12 MED ORDER — SODIUM CHLORIDE 0.9% IV SOLUTION: Freq: Once | INTRAVENOUS | Status: DC

## 2024-01-13 LAB — TYPE AND SCREEN
ABO/RH(D): A POS
Antibody Screen: NEGATIVE
Unit division: 0

## 2024-01-13 LAB — BPAM RBC
Blood Product Expiration Date: 202508032359
ISSUE DATE / TIME: 202507030949
Unit Type and Rh: 6200

## 2024-01-19 DIAGNOSIS — Z1339 Encounter for screening examination for other mental health and behavioral disorders: Secondary | ICD-10-CM | POA: Diagnosis not present

## 2024-01-19 DIAGNOSIS — Z0000 Encounter for general adult medical examination without abnormal findings: Secondary | ICD-10-CM | POA: Diagnosis not present

## 2024-01-19 DIAGNOSIS — Z1331 Encounter for screening for depression: Secondary | ICD-10-CM | POA: Diagnosis not present

## 2024-01-19 DIAGNOSIS — D649 Anemia, unspecified: Secondary | ICD-10-CM | POA: Diagnosis not present

## 2024-01-19 DIAGNOSIS — E039 Hypothyroidism, unspecified: Secondary | ICD-10-CM | POA: Diagnosis not present

## 2024-01-19 DIAGNOSIS — M542 Cervicalgia: Secondary | ICD-10-CM | POA: Diagnosis not present

## 2024-01-23 ENCOUNTER — Other Ambulatory Visit: Payer: Self-pay | Admitting: Internal Medicine

## 2024-01-23 DIAGNOSIS — D649 Anemia, unspecified: Secondary | ICD-10-CM | POA: Diagnosis not present

## 2024-01-23 DIAGNOSIS — R195 Other fecal abnormalities: Secondary | ICD-10-CM

## 2024-01-25 DIAGNOSIS — Z9889 Other specified postprocedural states: Secondary | ICD-10-CM | POA: Diagnosis not present

## 2024-01-25 DIAGNOSIS — H401134 Primary open-angle glaucoma, bilateral, indeterminate stage: Secondary | ICD-10-CM | POA: Diagnosis not present

## 2024-01-25 DIAGNOSIS — Z961 Presence of intraocular lens: Secondary | ICD-10-CM | POA: Diagnosis not present

## 2024-02-07 ENCOUNTER — Ambulatory Visit
Admission: RE | Admit: 2024-02-07 | Discharge: 2024-02-07 | Disposition: A | Discharge status: Home / Self Care | Source: Ambulatory Visit | Attending: Internal Medicine | Admitting: Internal Medicine

## 2024-02-07 DIAGNOSIS — D649 Anemia, unspecified: Secondary | ICD-10-CM

## 2024-02-07 DIAGNOSIS — R195 Other fecal abnormalities: Secondary | ICD-10-CM

## 2024-02-07 DIAGNOSIS — K5730 Diverticulosis of large intestine without perforation or abscess without bleeding: Secondary | ICD-10-CM | POA: Diagnosis not present

## 2024-02-07 MED ORDER — IOPAMIDOL (ISOVUE-300) INJECTION 61%
80.0000 mL | Freq: Once | INTRAVENOUS | Status: AC | PRN
  Administered 2024-02-07: 80 mL via INTRAVENOUS

## 2024-02-20 DIAGNOSIS — D649 Anemia, unspecified: Secondary | ICD-10-CM | POA: Diagnosis not present

## 2024-03-05 DIAGNOSIS — K080 Exfoliation of teeth due to systemic causes: Secondary | ICD-10-CM | POA: Diagnosis not present

## 2024-04-03 DIAGNOSIS — H401134 Primary open-angle glaucoma, bilateral, indeterminate stage: Secondary | ICD-10-CM | POA: Diagnosis not present

## 2024-04-03 DIAGNOSIS — H18593 Other hereditary corneal dystrophies, bilateral: Secondary | ICD-10-CM | POA: Diagnosis not present

## 2024-04-03 DIAGNOSIS — H02105 Unspecified ectropion of left lower eyelid: Secondary | ICD-10-CM | POA: Diagnosis not present

## 2024-04-03 DIAGNOSIS — H02102 Unspecified ectropion of right lower eyelid: Secondary | ICD-10-CM | POA: Diagnosis not present

## 2024-04-23 DIAGNOSIS — M5459 Other low back pain: Secondary | ICD-10-CM | POA: Diagnosis not present

## 2024-05-10 ENCOUNTER — Other Ambulatory Visit: Payer: Self-pay | Admitting: Nurse Practitioner

## 2024-05-10 ENCOUNTER — Ambulatory Visit
Admission: RE | Admit: 2024-05-10 | Discharge: 2024-05-10 | Disposition: A | Source: Ambulatory Visit | Attending: Nurse Practitioner

## 2024-05-10 DIAGNOSIS — R443 Hallucinations, unspecified: Secondary | ICD-10-CM

## 2024-05-10 DIAGNOSIS — G3189 Other specified degenerative diseases of nervous system: Secondary | ICD-10-CM | POA: Diagnosis not present

## 2024-05-10 DIAGNOSIS — N3001 Acute cystitis with hematuria: Secondary | ICD-10-CM | POA: Diagnosis not present

## 2024-05-30 DIAGNOSIS — H401134 Primary open-angle glaucoma, bilateral, indeterminate stage: Secondary | ICD-10-CM | POA: Diagnosis not present

## 2024-05-30 DIAGNOSIS — Z961 Presence of intraocular lens: Secondary | ICD-10-CM | POA: Diagnosis not present

## 2024-05-30 DIAGNOSIS — Z9889 Other specified postprocedural states: Secondary | ICD-10-CM | POA: Diagnosis not present
